# Patient Record
Sex: Female | Born: 1990 | Race: White | Hispanic: No | Marital: Single | Smoking: Never smoker
Health system: Southern US, Community
[De-identification: ages and names within clinical notes are randomized; demographics above are authoritative.]

## PROBLEM LIST (undated history)

## (undated) DIAGNOSIS — D126 Benign neoplasm of colon, unspecified: Secondary | ICD-10-CM

## (undated) DIAGNOSIS — E282 Polycystic ovarian syndrome: Secondary | ICD-10-CM

## (undated) DIAGNOSIS — E119 Type 2 diabetes mellitus without complications: Secondary | ICD-10-CM

## (undated) DIAGNOSIS — F39 Unspecified mood [affective] disorder: Secondary | ICD-10-CM

## (undated) DIAGNOSIS — E669 Obesity, unspecified: Secondary | ICD-10-CM

## (undated) DIAGNOSIS — F419 Anxiety disorder, unspecified: Secondary | ICD-10-CM

## (undated) DIAGNOSIS — K219 Gastro-esophageal reflux disease without esophagitis: Secondary | ICD-10-CM

## (undated) DIAGNOSIS — N921 Excessive and frequent menstruation with irregular cycle: Secondary | ICD-10-CM

## (undated) DIAGNOSIS — Q7962 Hypermobile Ehlers-Danlos syndrome: Secondary | ICD-10-CM

## (undated) DIAGNOSIS — G43909 Migraine, unspecified, not intractable, without status migrainosus: Secondary | ICD-10-CM

## (undated) DIAGNOSIS — R5382 Chronic fatigue, unspecified: Secondary | ICD-10-CM

## (undated) DIAGNOSIS — F32A Depression, unspecified: Secondary | ICD-10-CM

## (undated) DIAGNOSIS — G4719 Other hypersomnia: Secondary | ICD-10-CM

## (undated) HISTORY — DX: Depression, unspecified: F32.A

## (undated) HISTORY — DX: Other hypersomnia: G47.19

## (undated) HISTORY — PX: TONSILLECTOMY: SUR1361

## (undated) HISTORY — DX: Hypermobile Ehlers-Danlos syndrome: Q79.62

## (undated) HISTORY — DX: Obesity, unspecified: E66.9

## (undated) HISTORY — DX: Benign neoplasm of colon, unspecified: D12.6

## (undated) HISTORY — DX: Unspecified mood (affective) disorder: F39

## (undated) HISTORY — DX: Polycystic ovarian syndrome: E28.2

## (undated) HISTORY — DX: Excessive and frequent menstruation with irregular cycle: N92.1

## (undated) HISTORY — DX: Type 2 diabetes mellitus without complications: E11.9

## (undated) HISTORY — DX: Chronic fatigue, unspecified: R53.82

## (undated) HISTORY — DX: Gastro-esophageal reflux disease without esophagitis: K21.9

---
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
## 2012-03-09 DIAGNOSIS — F411 Generalized anxiety disorder: Secondary | ICD-10-CM | POA: Insufficient documentation

## 2012-03-09 DIAGNOSIS — M791 Myalgia, unspecified site: Secondary | ICD-10-CM | POA: Insufficient documentation

## 2012-07-04 DIAGNOSIS — R5383 Other fatigue: Secondary | ICD-10-CM | POA: Insufficient documentation

## 2012-12-24 ENCOUNTER — Emergency Department (INDEPENDENT_AMBULATORY_CARE_PROVIDER_SITE_OTHER)
Admission: EM | Admit: 2012-12-24 | Discharge: 2012-12-24 | Disposition: A | Discharge status: Nursing Home (Includes ICF) | Payer: PRIVATE HEALTH INSURANCE | Source: Home / Self Care | Attending: Family Medicine | Admitting: Family Medicine

## 2012-12-24 ENCOUNTER — Emergency Department (HOSPITAL_COMMUNITY)
Admission: EM | Admit: 2012-12-24 | Discharge: 2012-12-24 | Disposition: A | Discharge status: Home / Self Care | Payer: 59 | Attending: Emergency Medicine | Admitting: Emergency Medicine

## 2012-12-24 ENCOUNTER — Encounter (HOSPITAL_COMMUNITY): Payer: Self-pay | Admitting: Emergency Medicine

## 2012-12-24 ENCOUNTER — Emergency Department (HOSPITAL_COMMUNITY): Payer: 59

## 2012-12-24 DIAGNOSIS — R1011 Right upper quadrant pain: Secondary | ICD-10-CM | POA: Insufficient documentation

## 2012-12-24 DIAGNOSIS — R110 Nausea: Secondary | ICD-10-CM | POA: Insufficient documentation

## 2012-12-24 DIAGNOSIS — F411 Generalized anxiety disorder: Secondary | ICD-10-CM | POA: Insufficient documentation

## 2012-12-24 DIAGNOSIS — R109 Unspecified abdominal pain: Secondary | ICD-10-CM

## 2012-12-24 DIAGNOSIS — R1031 Right lower quadrant pain: Secondary | ICD-10-CM | POA: Insufficient documentation

## 2012-12-24 DIAGNOSIS — G43909 Migraine, unspecified, not intractable, without status migrainosus: Secondary | ICD-10-CM | POA: Insufficient documentation

## 2012-12-24 DIAGNOSIS — Z79899 Other long term (current) drug therapy: Secondary | ICD-10-CM | POA: Insufficient documentation

## 2012-12-24 HISTORY — DX: Anxiety disorder, unspecified: F41.9

## 2012-12-24 HISTORY — DX: Migraine, unspecified, not intractable, without status migrainosus: G43.909

## 2012-12-24 LAB — COMPREHENSIVE METABOLIC PANEL
ALT: 21 U/L (ref 0–35)
AST: 24 U/L (ref 0–37)
Alkaline Phosphatase: 77 U/L (ref 39–117)
CO2: 23 mEq/L (ref 19–32)
Chloride: 103 mEq/L (ref 96–112)
GFR calc Af Amer: >90 mL/min (ref >90)
GFR calc non Af Amer: 89 mL/min — ABNORMAL LOW (ref >90)
Glucose, Bld: 103 mg/dL — ABNORMAL HIGH (ref 70–99)
Potassium: 4.3 mEq/L (ref 3.5–5.1)
Sodium: 137 mEq/L (ref 135–145)
Total Bilirubin: 0.4 mg/dL (ref 0.3–1.2)

## 2012-12-24 LAB — CBC WITH DIFFERENTIAL/PLATELET
Basophils Absolute: 0 10*3/uL (ref 0.0–0.1)
Eosinophils Relative: 3 % (ref 0–5)
Lymphocytes Relative: 28 % (ref 12–46)
Lymphs Abs: 2.2 10*3/uL (ref 0.7–4.0)
MCHC: 34 g/dL (ref 30.0–36.0)
MCV: 83.6 fL (ref 78.0–100.0)
Monocytes Relative: 6 % (ref 3–12)
Neutro Abs: 5 10*3/uL (ref 1.7–7.7)
Platelets: 253 10*3/uL (ref 150–400)
RBC: 4.5 MIL/uL (ref 3.87–5.11)
RDW: 13.4 % (ref 11.5–15.5)
WBC: 7.8 10*3/uL (ref 4.0–10.5)

## 2012-12-24 LAB — POCT URINALYSIS DIP (DEVICE)
Bilirubin Urine: NEGATIVE
Glucose, UA: NEGATIVE mg/dL
Nitrite: NEGATIVE
Protein, ur: NEGATIVE mg/dL
Specific Gravity, Urine: 1.015 (ref 1.005–1.030)
Urobilinogen, UA: 0.2 mg/dL (ref 0.0–1.0)
pH: 5.5 (ref 5.0–8.0)

## 2012-12-24 LAB — POCT PREGNANCY, URINE: Preg Test, Ur: NEGATIVE

## 2012-12-24 MED ORDER — IOHEXOL 300 MG/ML  SOLN
25.0000 mL | INTRAMUSCULAR | Status: AC | PRN
  Administered 2012-12-24 (×2): 25 mL via ORAL

## 2012-12-24 MED ORDER — IOHEXOL 300 MG/ML  SOLN: 100.0000 mL | Freq: Once | INTRAMUSCULAR | Status: DC | PRN

## 2012-12-24 MED ORDER — ONDANSETRON HCL 4 MG/2ML IJ SOLN
4.0000 mg | Freq: Once | INTRAMUSCULAR | Status: AC
  Administered 2012-12-24: 4 mg via INTRAVENOUS
  Filled 2012-12-24: qty 2

## 2012-12-24 MED ORDER — SODIUM CHLORIDE 0.9 % IV BOLUS (SEPSIS)
1000.0000 mL | Freq: Once | INTRAVENOUS | Status: AC
  Administered 2012-12-24: 1000 mL via INTRAVENOUS

## 2012-12-24 MED ORDER — ONDANSETRON HCL 4 MG PO TABS: 4.0000 mg | ORAL_TABLET | Freq: Four times a day (QID) | ORAL | Status: DC

## 2012-12-24 NOTE — ED Notes (Signed)
CT made aware that pt finished her contrast.

## 2012-12-24 NOTE — ED Provider Notes (Signed)
CSN: [id number]     Arrival date & time 12/24/12  1103 History   First MD Initiated Contact with Patient 12/24/12 1108     Chief Complaint  Patient presents with  . Abdominal Pain   (Consider location/radiation/quality/duration/timing/severity/associated sxs/prior Treatment) Patient is a 22 y.o. female presenting with abdominal pain. The history is provided by the patient. No language interpreter was used.  Abdominal Pain Associated symptoms: nausea   Associated symptoms: no chest pain, no chills, no constipation, no diarrhea, no dysuria, no fever, no hematuria, no shortness of breath, no vaginal bleeding, no vaginal discharge and no vomiting   Kaitlyn Alvarez is a 22 y/o F with PMhx of anxiety and migraines presenting to the ED with abdominal pain that has been ongoing since 5:00Am yesterday morning. Patient reported that the pain at first started out in the upper abdominal region described as a burning, stinging sensation with the pain worsening yesterday, but mainly occuring in the lower abdomen. Patient reported that today she presents with pain localized to the right side of the abdomen that is described as a sharp, stabbing pain - burning- with radiation to the right flank. Patient reported that certain positions make the pain worse, reported that yesterday sitting made the pain worse, while laying down made the pain better. Patient reported that the pain worsens when laying down today, while sitting up makes the pain better. Patient reported that she has been experiencing mild nausea. Denied urinary symptoms, dysuria, hematuria, melena, hematochezia, diarrhea, fever, chills, vomiting, vaginal discharge, vaginal bleeding, vaginal pain. PCP none    Past Medical History  Diagnosis Date  . Anxiety   . Migraines    Past Surgical History  Procedure Laterality Date  . Tonsillectomy     No family history on file. History  Substance Use Topics  . Smoking status: Never Smoker   . Smokeless  tobacco: Not on file  . Alcohol Use: Yes     Comment: occasionally   OB History   Grav Para Term Preterm Abortions TAB SAB Ect Mult Living                 Review of Systems  Constitutional: Negative for fever and chills.  Respiratory: Negative for chest tightness and shortness of breath.   Cardiovascular: Negative for chest pain.  Gastrointestinal: Positive for nausea and abdominal pain. Negative for vomiting, diarrhea, constipation, blood in stool and anal bleeding.  Genitourinary: Positive for flank pain (right sided). Negative for dysuria, hematuria, vaginal bleeding, vaginal discharge and vaginal pain.  Musculoskeletal: Negative for back pain.  Neurological: Positive for headaches (patient has history of chronic migraines). Negative for dizziness and weakness.  All other systems reviewed and are negative.    Allergies  Review of patient's allergies indicates no known allergies.  Home Medications   Current Outpatient Rx  Name  Route  Sig  Dispense  Refill  . clonazePAM (KLONOPIN) 1 MG tablet   Oral   Take 1 mg by mouth 2 (two) times daily as needed for anxiety.         Marland Kitchen escitalopram (LEXAPRO) 20 MG tablet   Oral   Take 20 mg by mouth daily.         Marland Kitchen ibuprofen (ADVIL,MOTRIN) 200 MG tablet   Oral   Take 600 mg by mouth every 6 (six) hours as needed for pain.         Marland Kitchen ondansetron (ZOFRAN) 4 MG tablet   Oral   Take 1 tablet (4 mg  total) by mouth every 6 (six) hours.   12 tablet   0    BP 112/76  Pulse 88  Temp(Src) 97.9 F (36.6 C) (Oral)  Resp 16  SpO2 100%  LMP 11/23/2012 Physical Exam  Nursing note and vitals reviewed. Constitutional: She is oriented to person, place, and time. She appears well-developed and well-nourished. No distress.  Patient sitting comfortably in bed   HENT:  Head: Normocephalic and atraumatic.  Neck: Normal range of motion. Neck supple.  Cardiovascular: Normal rate, regular rhythm and normal heart sounds.  Exam reveals no  friction rub.   No murmur heard. Pulses:      Radial pulses are 2+ on the right side, and 2+ on the left side.  Pulmonary/Chest: Effort normal and breath sounds normal. No respiratory distress. She has no wheezes. She has no rales.  Abdominal: Soft. Bowel sounds are normal. She exhibits no distension. There is tenderness in the right upper quadrant and right lower quadrant. There is tenderness at McBurney's point and positive Murphy's sign. There is no guarding and no CVA tenderness.    Discomfort upon palpation to the right side of the abdomen Positive Murphy's sign Positive psoas and obturator Negative CVA tenderness bilaterally  Musculoskeletal: Normal range of motion.  Lymphadenopathy:    She has no cervical adenopathy.  Neurological: She is alert and oriented to person, place, and time. She exhibits normal muscle tone. Coordination normal.  Skin: Skin is warm and dry. No rash noted. She is not diaphoretic. No erythema.  Psychiatric: She has a normal mood and affect. Her behavior is normal. Thought content normal.    ED Course  Procedures (including critical care time)   Patient was seen and assessed [facility] this morning and was instructed to come to the ED to rule out for possible appendicitis. Urine was collected with negative infections - negative nitrites and negative leukocytes- and negative urine pregnancy.   2:27 PM Patient is difficult to get IV needed for contrast for CT abdomen and pelvis to rule out appendicitis - CT changed to [place].   2:32 PM Nurse spoke with patient on the phone regarding CT versus [place], since IV access has been lost. Patient reported that she would rather have the [place] than the CT scan, does not want IV to be placed in her arm again.   Results for orders placed during the hospital encounter of 12/24/12  CBC WITH DIFFERENTIAL      Result Value Range   WBC 7.8  4.0 - 10.5 K/uL   RBC 4.50  3.87 - 5.11 MIL/uL   Hemoglobin 12.8  12.0 - 15.0  g/dL   HCT 16.1  09.6 - 04.5 %   MCV 83.6  78.0 - 100.0 fL   MCH 28.4  26.0 - 34.0 pg   MCHC 34.0  30.0 - 36.0 g/dL   RDW 40.9  81.1 - 91.4 %   Platelets 253  150 - 400 K/uL   Neutrophils Relative % 63  43 - 77 %   Neutro Abs 5.0  1.7 - 7.7 K/uL   Lymphocytes Relative 28  12 - 46 %   Lymphs Abs 2.2  0.7 - 4.0 K/uL   Monocytes Relative 6  3 - 12 %   Monocytes Absolute 0.5  0.1 - 1.0 K/uL   Eosinophils Relative 3  0 - 5 %   Eosinophils Absolute 0.2  0.0 - 0.7 K/uL   Basophils Relative 0  0 - 1 %  Basophils Absolute 0.0  0.0 - 0.1 K/uL  COMPREHENSIVE METABOLIC PANEL      Result Value Range   Sodium 137  135 - 145 mEq/L   Potassium 4.3  3.5 - 5.1 mEq/L   Chloride 103  96 - 112 mEq/L   CO2 23  19 - 32 mEq/L   Glucose, Bld 103 (*) 70 - 99 mg/dL   BUN 11  6 - 23 mg/dL   Creatinine, Ser 1.61  0.50 - 1.10 mg/dL   Calcium 9.4  8.4 - 09.6 mg/dL   Total Protein 7.9  6.0 - 8.3 g/dL   Albumin 4.0  3.5 - 5.2 g/dL   AST 24  0 - 37 U/L   ALT 21  0 - 35 U/L   Alkaline Phosphatase 77  39 - 117 U/L   Total Bilirubin 0.4  0.3 - 1.2 mg/dL   GFR calc non Af Amer 89 (*) >90 mL/min   GFR calc Af Amer >90  >90 mL/min  LIPASE, BLOOD      Result Value Range   Lipase 22  11 - 59 U/L  CG4 I-STAT (LACTIC ACID)      Result Value Range   Lactic Acid, Venous 0.74  0.5 - 2.2 mmol/L    Labs Review Labs Reviewed  COMPREHENSIVE METABOLIC PANEL - Abnormal; Notable for the following:    Glucose, Bld 103 (*)    GFR calc non Af Amer 89 (*)    All other components within normal limits  CBC WITH DIFFERENTIAL  LIPASE, BLOOD  CG4 I-STAT (LACTIC ACID)   Imaging Review US Abdomen Complete  12/24/2012   CLINICAL DATA:  Right upper quadrant abdominal pain with nausea.  EXAM: ULTRASOUND ABDOMEN COMPLETE  COMPARISON:  None.  FINDINGS: Gallbladder  No gallstones or wall thickening visualized. No sonographic Murphy sign noted.  Common bile duct  Diameter: 3.8 mm. No evidence of an intraductal calculus.  Liver  No  focal lesion identified. The left lobe is suboptimally visualized.  IVC  No abnormality visualized. The intrahepatic IVC is not well seen.  Pancreas  Visualized portion unremarkable.  Spleen  Size and appearance within normal limits.  Right Kidney  Length: 11.3 cm. Echogenicity within normal limits. No mass or hydronephrosis visualized.  Left Kidney  Length: 10.8 cm. Echogenicity within normal limits. No mass or hydronephrosis visualized.  Abdominal aorta  No aneurysm visualized. The proximal aorta is suboptimally visualized.  IMPRESSION: No acute abdominal findings. Portions of the abdomen are obscured by bowel gas.   Electronically Signed   By: Roxy Horseman M.D.   On: 12/24/2012 15:49   US Transvaginal Non-ob  12/24/2012   CLINICAL DATA:  Evaluate for possible torsion.  EXAM: TRANSABDOMINAL AND TRANSVAGINAL ULTRASOUND OF PELVIS  DOPPLER ULTRASOUND OF OVARIES  TECHNIQUE: Both transabdominal and transvaginal ultrasound examinations of the pelvis were performed. Transabdominal technique was performed for global imaging of the pelvis including uterus, ovaries, adnexal regions, and pelvic cul-de-sac.  It was necessary to proceed with endovaginal exam following the transabdominal exam to visualize the ovaries. Color and duplex Doppler ultrasound was utilized to evaluate blood flow to the ovaries.  COMPARISON:  None.  FINDINGS: Uterus  Measurements: 7.4 x 3.1 x 3.1 cm. No fibroids or other mass visualized.  Endometrium  Thickness: 4.9 mm.  No focal abnormality visualized.  Right ovary  Measurements: 3.9 x 2.3 x 2.2 cm. Normal appearance/no adnexal mass.  Left ovary  Measurements: 3.1 x 1.6 x 1.5 cm. Normal appearance/no adnexal  mass.  Pulsed Doppler evaluation of both ovaries demonstrates normal low-resistance arterial and venous waveforms.  Other findings  No free fluid.  IMPRESSION: 1. Normal appearance of the uterus and ovaries. 2. No evidence for torsion.   Electronically Signed   By: Rosalie Gums M.D.   On:  12/24/2012 15:45   US Pelvis Complete  12/24/2012   CLINICAL DATA:  Evaluate for possible torsion.  EXAM: TRANSABDOMINAL AND TRANSVAGINAL ULTRASOUND OF PELVIS  DOPPLER ULTRASOUND OF OVARIES  TECHNIQUE: Both transabdominal and transvaginal ultrasound examinations of the pelvis were performed. Transabdominal technique was performed for global imaging of the pelvis including uterus, ovaries, adnexal regions, and pelvic cul-de-sac.  It was necessary to proceed with endovaginal exam following the transabdominal exam to visualize the ovaries. Color and duplex Doppler ultrasound was utilized to evaluate blood flow to the ovaries.  COMPARISON:  None.  FINDINGS: Uterus  Measurements: 7.4 x 3.1 x 3.1 cm. No fibroids or other mass visualized.  Endometrium  Thickness: 4.9 mm.  No focal abnormality visualized.  Right ovary  Measurements: 3.9 x 2.3 x 2.2 cm. Normal appearance/no adnexal mass.  Left ovary  Measurements: 3.1 x 1.6 x 1.5 cm. Normal appearance/no adnexal mass.  Pulsed Doppler evaluation of both ovaries demonstrates normal low-resistance arterial and venous waveforms.  Other findings  No free fluid.  IMPRESSION: 1. Normal appearance of the uterus and ovaries. 2. No evidence for torsion.   Electronically Signed   By: Rosalie Gums M.D.   On: 12/24/2012 15:45   US Abdomen Limited  12/24/2012   CLINICAL DATA:  Right lower quadrant tenderness.  EXAM: LIMITED ABDOMINAL ULTRASOUND  TECHNIQUE: Wallace Cullens scale imaging of the right lower quadrant was performed to evaluate for suspected appendicitis. Standard imaging planes and graded compression technique were utilized.  COMPARISON:  None.  FINDINGS: The appendix is not visualized.  Ancillary findings: None.  Factors affecting image quality: None.  IMPRESSION: No ultrasound evidence for acute appendicitis.   Electronically Signed   By: Rosalie Gums M.D.   On: 12/24/2012 15:38   [place] Art/ven Flow Abd Pelv Doppler  12/24/2012   CLINICAL DATA:  Evaluate for possible torsion.   EXAM: TRANSABDOMINAL AND TRANSVAGINAL ULTRASOUND OF PELVIS  DOPPLER ULTRASOUND OF OVARIES  TECHNIQUE: Both transabdominal and transvaginal ultrasound examinations of the pelvis were performed. Transabdominal technique was performed for global imaging of the pelvis including uterus, ovaries, adnexal regions, and pelvic cul-de-sac.  It was necessary to proceed with endovaginal exam following the transabdominal exam to visualize the ovaries. Color and duplex Doppler ultrasound was utilized to evaluate blood flow to the ovaries.  COMPARISON:  None.  FINDINGS: Uterus  Measurements: 7.4 x 3.1 x 3.1 cm. No fibroids or other mass visualized.  Endometrium  Thickness: 4.9 mm.  No focal abnormality visualized.  Right ovary  Measurements: 3.9 x 2.3 x 2.2 cm. Normal appearance/no adnexal mass.  Left ovary  Measurements: 3.1 x 1.6 x 1.5 cm. Normal appearance/no adnexal mass.  Pulsed Doppler evaluation of both ovaries demonstrates normal low-resistance arterial and venous waveforms.  Other findings  No free fluid.  IMPRESSION: 1. Normal appearance of the uterus and ovaries. 2. No evidence for torsion.   Electronically Signed   By: Rosalie Gums M.D.   On: 12/24/2012 15:45    EKG Interpretation   None       MDM   1. Abdominal pain    Medications  sodium chloride 0.9 % bolus 1,000 mL (0 mLs Intravenous Stopped 12/24/12 1550)  ondansetron (  ZOFRAN) injection 4 mg (4 mg Intravenous Given 12/24/12 1157)  iohexol (OMNIPAQUE) 300 MG/ML solution 25 mL (25 mLs Oral Contrast Given 12/24/12 1230)   Filed Vitals:   12/24/12 1110 12/24/12 1600  BP: 113/76 112/76  Pulse: 94 88  Temp: 97.9 F (36.6 C)   TempSrc: Oral   Resp: 16   SpO2: 96% 100%     Patient presenting to emergency department with abdominal pain that started approximately 5:00 AM yesterday morning. Patient reports that the abdominal pain started in the upper quadrants and has moved its way to the lower quadrant discomfort on the right side abdominal pain  described as a sharp stabbing pain with radiation towards the right flank. Associated symptoms of nausea. Negative emesis and diarrhea. Patient was seen in urgent care Center and recommended to come to emergency department for possible rule out of appendicitis. Urine was collected at urgent care Center with negative findings for urinary tract infections. Urine pregnancy negative. Alert and oriented. Bowel sounds normoactive in all 4 quadrants. Discomfort upon palpation to the right lower quadrant, right upper quadrant and right side of the abdomen. Positive Murphy's sign. Positive McBurney's point. Positive psoas and obturator sign. CBC negative elevation white blood cell count, negative leukocytosis identified. CMP negative findings. Lipase negative elevation. Lactic acid negative elevation. Patient given IV fluids and IV antiemetics. CT scan discontinued since IV line infiltrated, patient agreed to [place] to be performed.  US abdomen negative for gallstones or gallbladder wall thickening. No evidence of common bile duct dilation. Pancreas negative findings. Negative stones or hydronephrosis noted in kidneys bilaterally. [place] of Art/Ven to the abdo/pelvis negative for ovarian torsion. Normal appearance to ovaries and uterus noted. Negative findings for evidence of acute appendicitis.  Doubt appendicitis. Doubt ectopic pregnancy. Doubt cholecystitis. Doubt kidney stones. Doubt pyelonephritis. Etiology of abdominal pain unknown. Patient stable, afebrile. Negative episodes of emesis while in ED setting. Nausea controlled. Patient able to tolerate fluids PO. Discharged patient with antiemetics. Referred patient to PCP and gastroenterology. Discussed with patient to rest and stay hydrated. Discussed with patient diet. Discussed with patient to continue to monitor symptoms and if symptoms are to worsen or change to report back to the ED - strict return instructions given. Patient agreed to plan of care, understood, all  questions answered.   Raymon Mutton, PA-C 12/25/12 1615  Raymon Mutton, PA-C 12/25/12 1617

## 2012-12-24 NOTE — ED Notes (Signed)
Rt sided abd pain that has gotten worse denies dysuria pain is sharp and stabbing went to [facility] and was sent here for ? appy

## 2012-12-24 NOTE — ED Notes (Signed)
Pt returned from ultrasound. Reports mild pain, denies nausea.

## 2012-12-24 NOTE — ED Notes (Addendum)
Pt in CT, IV infiltrated and CT attempted 2 times to start IV with no success. Pt now being transported to ultrasound where she will have imaging done on gall bladder and appendix. Ultrasound made aware.

## 2012-12-24 NOTE — ED Notes (Signed)
Results of lactic acid called to primary nurse [place]

## 2012-12-24 NOTE — ED Notes (Signed)
Contacted CT about wait time.  

## 2012-12-24 NOTE — ED Provider Notes (Signed)
Kaitlyn Alvarez is a 22 y.o. female who presents to Urgent Care today for right-sided abdominal pain. Patient notes initially mild but now moderate to severe right-sided abdominal pain. Her symptoms started at 5:00 yesterday morning. He awoke her from sleep. Her pain was initially upper but now is more located in the right lower quadrant.  She additionally notes loss of appetite with her last meal being yesterday. She has tried over-the-counter pain medications, and Gas-X and TUMS which have not helped. She does not think her pain is related to food. She notes the pain is constant and worsening. The pain interfered with sleep last night. No vomiting or diarrhea. Patient is currently being worked up for celiac disease as her father has this.    Past Medical History  Diagnosis Date  . Anxiety   . Migraines    No surgical history aside from tonsillectomy. History  Substance Use Topics  . Smoking status: Never Smoker   . Smokeless tobacco: Not on file  . Alcohol Use: Yes     Comment: occasionally   ROS as above Medications reviewed. No current facility-administered medications for this encounter.   Current Outpatient Prescriptions  Medication Sig Dispense Refill  . escitalopram (LEXAPRO) 20 MG tablet Take 20 mg by mouth daily.      . clonazePAM (KLONOPIN) 1 MG tablet Take 1 mg by mouth 2 (two) times daily as needed for anxiety.        Exam:  BP 128/85  Pulse 74  Temp(Src) 98 F (36.7 C) (Oral)  Resp 17  SpO2 97%  LMP 11/23/2012 Gen: Well NAD HEENT: EOMI,  MMM Lungs: CTABL Nl WOB Heart: RRR no MRG Abd: Hypoactive bowel sounds. Nondistended. Tender to palpation in the right lower quadrant and right upper quadrant. Mildly positive Murphy sign. Guarding in the right lower quadrant with mild rebound. Exts: Non edematous BL  LE, warm and well perfused.   Results for orders placed during the hospital encounter of 12/24/12 (from the past 24 hour(s))  POCT URINALYSIS DIP (DEVICE)      Status: Abnormal   Collection Time    12/24/12 10:48 AM      Result Value Range   Glucose, UA NEGATIVE  NEGATIVE mg/dL   Bilirubin Urine NEGATIVE  NEGATIVE   Ketones, ur NEGATIVE  NEGATIVE mg/dL   Specific Gravity, Urine 1.015  1.005 - 1.030   Hgb urine dipstick TRACE (*) NEGATIVE   pH 5.5  5.0 - 8.0   Protein, ur NEGATIVE  NEGATIVE mg/dL   Urobilinogen, UA 0.2  0.0 - 1.0 mg/dL   Nitrite NEGATIVE  NEGATIVE   Leukocytes, UA NEGATIVE  NEGATIVE  POCT PREGNANCY, URINE     Status: None   Collection Time    12/24/12 10:54 AM      Result Value Range   Preg Test, Ur NEGATIVE  NEGATIVE   No results found.  Assessment and Plan: 22 y.o. female with abdominal pain. Concerning for gallstones or appendicitis. Patient likely needs abdominal imaging to further evaluate this issue. I will not delay diagnosis by obtaining laboratory findings as I do not feel that normal labs and be totally reassuring. Plan to transfer to ED for further evaluation and management via shuttle.  Discussed warning signs or symptoms. Please see discharge instructions. Patient expresses understanding.      Rodolph Bong, MD 12/24/12 1101

## 2012-12-24 NOTE — ED Notes (Signed)
Onset yesterday  At 0500 pt woke with upper ABD pain, now lower ABD pain with radiation to the right side.   She has also had nausea, no emesis.  Last meal was last night.

## 2013-01-01 NOTE — ED Provider Notes (Signed)
Medical screening examination/treatment/procedure(s) were conducted as a shared visit with non-physician practitioner(s) and myself.  I personally evaluated the patient during the encounter.  EKG Interpretation   None       Pt examined.  No peritoneal irritation.  History of pain for over 30 hours.  Not a surgical abdomen.  No history of evolving pain to suggest appendicitis. Exam shows non localizing pain.  I don't think CT is indicates with low suspicion for Appendicitis.  Lab and U/S r/o cholecystitis.  Roney Marion, MD 01/01/13 [phone]

## 2013-11-08 ENCOUNTER — Ambulatory Visit (INDEPENDENT_AMBULATORY_CARE_PROVIDER_SITE_OTHER): Payer: BC Managed Care – PPO | Admitting: Psychiatry

## 2013-11-08 ENCOUNTER — Encounter (HOSPITAL_COMMUNITY): Payer: Self-pay | Admitting: Psychiatry

## 2013-11-08 VITALS — BP 117/78 | HR 112 | Ht 64.0 in | Wt 190.0 lb

## 2013-11-08 DIAGNOSIS — F909 Attention-deficit hyperactivity disorder, unspecified type: Secondary | ICD-10-CM

## 2013-11-08 DIAGNOSIS — F902 Attention-deficit hyperactivity disorder, combined type: Secondary | ICD-10-CM

## 2013-11-08 MED ORDER — LISDEXAMFETAMINE DIMESYLATE 40 MG PO CAPS: 40.0000 mg | ORAL_CAPSULE | ORAL | Status: DC

## 2013-11-08 NOTE — Progress Notes (Signed)
Psychiatric Assessment Adult  Patient Identification:  Kaitlyn Alvarez Date of Evaluation:  11/08/2013 Chief Complaint: ADHD History of Chief Complaint:  No chief complaint on file.   HPI Pt is 23 year old Caucasian female, with h/o ADHD, and was placed on Vyvanse 30 mg po QAM.She reports it wears off in afternoon Prescription written at [facility]. She has one psychiatric hospitalization at [facility], in which she had anxiety. Sleeping and eating are normal. Concentration is fair. She denies SI/HI/AVH. She denies depression, anxiety, mania, or psychosis. Euthymic. She is a Consulting civil engineer, and needs to focus. She is unemployed, and lives at [place]. Will increase Vyvanse to 40 mg po for ADHD symptoms. Rtc in 4 weeks  Review of Systems Physical Exam  Depressive Symptoms: denies   (Hypo) Manic Symptoms:   Elevated Mood:  No Irritable Mood:  No Grandiosity:  No Distractibility:  Yes Labiality of Mood:  No Delusions:  No Hallucinations:  No Impulsivity:  Yes Sexually Inappropriate Behavior:  No Financial Extravagance:  No Flight of Ideas:  No  Anxiety Symptoms: Excessive Worry:  No Panic Symptoms:  No Agoraphobia:  No Obsessive Compulsive: No  Symptoms: None, Specific Phobias:  No Social Anxiety:  No  Psychotic Symptoms:  Hallucinations: No None Delusions:  No Paranoia:  No   Ideas of Reference:  No  PTSD Symptoms: Ever had a traumatic exposure:  No Had a traumatic exposure in the last month:  No Re-experiencing: No None Hypervigilance:  No Hyperarousal: No None Avoidance: No None  Traumatic Brain Injury: No   Past Psychiatric History: Diagnosis: ADHD  Hospitalizations: [facility] in October 2012, for anxiety from Gluten, having Migraines   Outpatient Care: yes   Substance Abuse Care: no  Self-Mutilation: cutting behaviors at age 77 r/t anxiety, but has stopped.   Suicidal Attempts: no  Violent Behaviors: no   Past Medical History:   Past Medical History   Diagnosis Date  . Anxiety   . Migraines    History of Loss of Consciousness:  No Seizure History:  Yes Cardiac History:  No Allergies:  No Known Allergies Current Medications:  Current Outpatient Prescriptions  Medication Sig Dispense Refill  . clonazePAM (KLONOPIN) 1 MG tablet Take 1 mg by mouth 2 (two) times daily as needed for anxiety.      Marland Kitchen escitalopram (LEXAPRO) 20 MG tablet Take 20 mg by mouth daily.      Marland Kitchen ibuprofen (ADVIL,MOTRIN) 200 MG tablet Take 600 mg by mouth every 6 (six) hours as needed for pain.      Marland Kitchen ondansetron (ZOFRAN) 4 MG tablet Take 1 tablet (4 mg total) by mouth every 6 (six) hours.  12 tablet  0   No current facility-administered medications for this visit.    Previous Psychotropic Medications:  Medication Dose   see aboove                       Substance Abuse History in the last 12 months: None Substance Age of 1st Use Last Use Amount Specific Type  Nicotine      Alcohol  20  last weekend  2 drinks, a week. Mixed drinks    Cannabis      Opiates      Cocaine      Methamphetamines      LSD      Ecstasy      Benzodiazepines      Caffeine      Inhalants  Others:                         Social History: Current Place of Residence: [place] Place of Birth: [place] Family Members: live in [place]. Parents and brother,age 105  Marital Status:  Single Children:   Sons:   Daughters:  Relationships: none Education:  Corporate treasurer Problems/Performance: no Religious Beliefs/Practices: jewish, not very religious History of Abuse: none Occupational Experience: no Military History:  None. Legal History: no Hobbies/Interests: arts and crafts   Family History:  No family history on file.  Mental Status Examination/Evaluation: Objective:  Appearance: Casual, Fairly Groomed and Guarded Scientist, research (physical sciences)::  Fair  Speech:  Normal Rate  Volume:  Normal  Mood:  anxious  Affect:  Appropriate and Congruent   Thought Process:  Coherent and Linear  Orientation:  Full (Time, Place, and Person)  Thought Content:  Rumination  Suicidal Thoughts:  No  Homicidal Thoughts:  No  Judgement:  Fair  Insight:  Fair  Psychomotor Activity:  Restlessness  Akathisia:  No  Handed:  Right  AIMS (if indicated):  AIMS: Facial and Oral Movements Muscles of Facial Expression: None, normal Lips and Perioral Area: None, normal Jaw: None, normal Tongue: None, normal,Extremity Movements Upper (arms, wrists, hands, fingers): None, normal Lower (legs, knees, ankles, toes): None, normal, Trunk Movements Neck, shoulders, hips: None, normal, Overall Severity Severity of abnormal movements (highest score from questions above): None, normal Incapacitation due to abnormal movements: None, normal Patient's awareness of abnormal movements (rate only patient's report): No Awareness, Dental Status Current problems with teeth and/or dentures?: No Does patient usually wear dentures?: No  Assets:  Leisure Time Physical Health Resilience Social Support    Laboratory/X-Ray Psychological Evaluation(s)   NA  Dr. Marius Ditch   Assessment:  Axis I: ADHD, combined type  AXIS I ADHD, combined type  AXIS II Cluster B Traits  AXIS III Past Medical History  Diagnosis Date  . Anxiety   . Migraines      AXIS IV economic problems, educational problems, housing problems, other psychosocial or environmental problems, problems related to legal system/crime, problems related to social environment, problems with access to health care services and problems with primary support group  AXIS V 51-60 moderate symptoms   Treatment Plan/Recommendations:  Plan of Care: medication  Laboratory:    Psychotherapy: no  Medications: Vyvanse 40 mg po QD for ADHD  Routine PRN Medications:  No  Consultations: as needed   Safety Concerns:  no  Other:      Kendrick Fries, NP 9/17/20153:11 PM

## 2013-12-06 ENCOUNTER — Ambulatory Visit (INDEPENDENT_AMBULATORY_CARE_PROVIDER_SITE_OTHER): Payer: BC Managed Care – PPO | Admitting: Psychiatry

## 2013-12-06 VITALS — BP 119/72 | HR 80 | Ht 65.0 in | Wt 179.2 lb

## 2013-12-06 DIAGNOSIS — F902 Attention-deficit hyperactivity disorder, combined type: Secondary | ICD-10-CM

## 2013-12-06 DIAGNOSIS — F325 Major depressive disorder, single episode, in full remission: Secondary | ICD-10-CM

## 2013-12-10 NOTE — Progress Notes (Signed)
Patient ID: Kaitlyn Alvarez, female   DOB: 03/12/1990, 23 y.o.   MRN: [id number]  Psychiatric medication management visi  Patient Identification:  Kaitlyn MedicusStephanie Inga Date of Evaluation:  12/06/2013 Chief Complaint: ADHD History of Chief Complaint:   Chief Complaint  Patient presents with  . ADHD  . Follow-up    HPI Patient is 23 year old Caucasian female who presents today for followup visit. Patient reports that she was never diagnosed with ADHD but was prescribed Vyvanse at  [facility] which was then continued by Landis MartinsMeghan Blankman at this clinic.  She states that she has problems with staying focused , complete the work and gets distracted easily. She adds that her brother is diagnosed with ADHD and is a significant family history of ADHD. She reports that she has never been diagnosed or tested for ADHD but has had some benefit with the Vyvanse.  In regards to her past psychiatric history, patient reports that she was hospitalized many years ago at [facility]. She states that it was due to anxiety and depression. She currently denies any symptoms of depression or anxiety and reports that overall she is doing fairly well. She also denies any symptoms of mania, psychosis, any other complaints at this visit. Review of Systems  Constitutional: Negative.  Negative for activity change, appetite change, fatigue and unexpected weight change.  HENT: Negative.  Negative for congestion, dental problem, sinus pressure, sneezing, sore throat and trouble swallowing.   Eyes: Negative.  Negative for discharge and visual disturbance.  Respiratory: Negative.  Negative for apnea, shortness of breath and wheezing.   Cardiovascular: Negative.  Negative for palpitations.  Gastrointestinal: Negative.  Negative for nausea, vomiting, abdominal pain, diarrhea and constipation.  Endocrine: Negative.  Negative for cold intolerance and heat intolerance.  Genitourinary: Negative.  Negative for difficulty urinating, menstrual  problem and pelvic pain.  Musculoskeletal: Negative.  Negative for arthralgias and myalgias.  Skin: Negative.  Negative for color change.  Allergic/Immunologic: Negative.  Negative for food allergies.  Neurological: Negative.  Negative for dizziness, syncope, weakness and light-headedness.  Hematological: Negative.  Does not bruise/bleed easily.  Psychiatric/Behavioral: Positive for decreased concentration. Negative for suicidal ideas, hallucinations, behavioral problems, confusion, sleep disturbance, self-injury, dysphoric mood and agitation. The patient is not nervous/anxious and is not hyperactive.    Physical Exam Blood pressure 119/72, pulse 80, height 5\' 5"  (1.651 m), weight 179 lb 3.2 oz (81.285 kg).   Past Psychiatric History: Diagnosis: ADHD  Hospitalizations: [facility] in October 2012, for anxiety from Gluten, having Migraines   Outpatient Care: yes   Substance Abuse Care: no  Self-Mutilation: cutting behaviors at age 23 r/t anxiety, but has stopped.   Suicidal Attempts: no  Violent Behaviors: no   Past Medical History:   Past Medical History  Diagnosis Date  . Anxiety   . Migraines    History of Loss of Consciousness:  No Seizure History:  Yes Cardiac History:  No Allergies:  No Known Allergies Current Medications:  Current Outpatient Prescriptions  Medication Sig Dispense Refill  . HYDROcodone-acetaminophen (NORCO/VICODIN) 5-325 MG per tablet Take by mouth.      . lidocaine (XYLOCAINE) 2 % solution 200 mg.      . ondansetron (ZOFRAN) 4 MG tablet Take 4 mg by mouth.      . propranolol ER (INDERAL LA) 60 MG 24 hr capsule Take 60 mg by mouth.      . rizatriptan (MAXALT) 10 MG tablet Take 10 mg by mouth.      .Marland Kitchen  ibuprofen (ADVIL,MOTRIN) 200 MG tablet Take 600 mg by mouth every 6 (six) hours as needed for pain.      Marland Kitchen. ondansetron (ZOFRAN) 4 MG tablet Take 1 tablet (4 mg total) by mouth every 6 (six) hours.  12 tablet  0   No current facility-administered  medications for this visit.    Substance Abuse History in the last 12 months: None  Social History: Current Place of Residence: [place] Place of Birth: [place] Family Members: live in [place]. Parents and brother,age 65  Marital Status:  Single Children:   Sons:   Daughters:  Relationships: none Education:  [employer]ate [employer] Educational Problems/Performance: no Religious Beliefs/Practices: jewish, not very religious History of Abuse: none Occupational Experience: no Military History:  None. Legal History: no Hobbies/Interests: arts and crafts   Family History:  Patient's brother is diagnosed with ADHD combined type  General Appearance: alert, oriented, no acute distress and well nourished  Musculoskeletal: Strength & Muscle Tone: within normal limits Gait & Station: normal Patient leans: N/A Mental Status Examination/Evaluation: Objective:  Appearance: Casual, Fairly Groomed and Guarded Scientist, research (physical sciences)Green tips  Eye Contact::  Fair  Speech:  Normal Rate  Volume:  Normal  Mood:  anxious  Affect:  Appropriate and Congruent  Thought Process:  Coherent and Linear  Orientation:  Full (Time, Place, and Person)  Thought Content:  WDL  Suicidal Thoughts:  No  Homicidal Thoughts:  No  Judgement:  Fair  Insight:  Fair  Psychomotor Activity:  Normal  Akathisia:  No  Handed:  Right    Assets:  Leisure Time Physical Health Resilience Social Support     Assessment:  Axis I: 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 Anxiety   . Migraines      AXIS IV economic problems, educational problems, housing problems, other psychosocial or environmental problems, problems related to legal system/crime, problems related to social environment, problems with access to health care services and problems with  primary support group  AXIS V 51-60 moderate symptoms   Treatment Plan/Recommendations:  Plan of Care: discontinue Vyvanse  Laboratory: none at this time   Psychotherapy: discussed with patient the need to see a therapist to help her time management, organizational skills and coping  Medications: none at this time  Routine PRN Medications:  No  Consultations: discussed the need to have ADHD testing done, information about psychologist who could do this testing was given to patient at this visit  Safety Concerns:  None reported  Other:  Call when necessary Followup with Dr. Laurelyn SickleAggarwal once testing is completed    Nelly RoutKUMAR,Nivek Powley, MD 10/19/20157:15 AM

## 2013-12-14 ENCOUNTER — Ambulatory Visit (INDEPENDENT_AMBULATORY_CARE_PROVIDER_SITE_OTHER): Payer: No Typology Code available for payment source | Admitting: Psychology

## 2013-12-14 DIAGNOSIS — F902 Attention-deficit hyperactivity disorder, combined type: Secondary | ICD-10-CM

## 2014-01-03 ENCOUNTER — Encounter (HOSPITAL_COMMUNITY): Payer: Self-pay | Admitting: Psychiatry

## 2014-01-03 ENCOUNTER — Ambulatory Visit (INDEPENDENT_AMBULATORY_CARE_PROVIDER_SITE_OTHER): Payer: BC Managed Care – PPO | Admitting: Psychiatry

## 2014-01-03 VITALS — BP 116/75 | HR 95 | Ht 65.0 in | Wt 177.6 lb

## 2014-01-03 DIAGNOSIS — F325 Major depressive disorder, single episode, in full remission: Secondary | ICD-10-CM

## 2014-01-03 DIAGNOSIS — F909 Attention-deficit hyperactivity disorder, unspecified type: Secondary | ICD-10-CM | POA: Insufficient documentation

## 2014-01-03 DIAGNOSIS — F900 Attention-deficit hyperactivity disorder, predominantly inattentive type: Secondary | ICD-10-CM

## 2014-01-03 NOTE — Progress Notes (Signed)
[facility] [id number] Progress Note  Kaitlyn Alvarez [id number] 23 y.o.  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 States after being taken off Vyvanse she has not been doing well. Pt has trouble with procrastination, easily distracted, unable to focus on one task, poor memory, difficulty doing complex tasks, misplacing items such as phone, keys and wallet. Pt states her grades are starting to suffer. States grades are usually good b/c she has school anxiety and will cram the night before an exam. Reports she school transcripts will show straight A's.    Pt had psychological testing at [facility] which showed low-average to low functioning on the CNS. The diagnostic impression is ADHD-primarily Inattentive presentation.  Pt was seeing Dr. Marlise [place]vesew in [place] on/off for 2 yrs. States Dr. Marlise Evesew began treating pt with Vyvanse while being undiagnosed with ADHD.   Today pt denies depression and anxiety. Denies isolation, worthlessness, crying spells and anhedonia. Sleep is good. Appetite and energy are good.   Suicidal Ideation: No Plan Formed: No Patient has means to carry out plan: No  Homicidal Ideation: No Plan Formed: No Patient has means to carry out plan: No  Review of Systems: Psychiatric: Agitation: No Hallucination: No Depressed Mood: No Insomnia: No Hypersomnia: No Altered Concentration: Yes Feels Worthless: No Grandiose Ideas: No Belief In Special Powers: No New/Increased Substance Abuse: No Compulsions: No  Neurologic: Headache: Yes migraines improved when gluten was removed from her diet Seizure: No Paresthesias: No   Review of Systems  Constitutional: Negative for fever, chills and weight loss.  HENT: Negative for congestion, ear discharge and sore throat.   Eyes: Negative for blurred vision, double vision and redness.  Respiratory: Negative for  cough, hemoptysis, shortness of breath and wheezing.   Cardiovascular: Negative for chest pain, palpitations and leg swelling.  Gastrointestinal: Negative for heartburn, nausea, vomiting and abdominal pain.  Musculoskeletal: Negative for myalgias, back pain, joint pain and neck pain.  Skin: Negative for itching and rash.  Neurological: Positive for headaches. Negative for dizziness, tingling, seizures and weakness.  Psychiatric/Behavioral: Negative for depression, suicidal ideas, hallucinations, memory loss and substance abuse. The patient is not nervous/anxious and does not have insomnia.      Past Medical Family, Social History: pt living with friends. Goes to [employer] studying art. Her parents live in [place] and she has one brother who is 18yo. Pt is single.  reports that she has never smoked. She has never used smokeless tobacco. She reports that she drinks about 1.2 oz of alcohol per week. She reports that she does not use illicit drugs.  Family History  Problem Relation Age of Onset  . ADD / ADHD Brother    Past Medical History  Diagnosis Date  . Anxiety   . Migraines    Outpatient Encounter Prescriptions as of 01/03/2014  Medication Sig  . HYDROcodone-acetaminophen (NORCO/VICODIN) 5-325 MG per tablet Take by mouth.  . ondansetron (ZOFRAN) 4 MG tablet Take 1 tablet (4 mg total) by mouth every 6 (six) hours.  . propranolol ER (INDERAL LA) 60 MG 24 hr capsule Take 60 mg by mouth.  . rizatriptan (MAXALT) 10 MG tablet Take 10 mg by mouth.  Marland Kitchen. ibuprofen (ADVIL,MOTRIN) 200 MG tablet Take 600 mg by mouth every 6 (six) hours as needed for pain.  Marland Kitchen. lidocaine (XYLOCAINE) 2 % solution 200 mg.    Past Psychiatric History/Hospitalization(s): Anxiety:  Yes Bipolar Disorder: No Depression: Yes Mania: No Psychosis: No Schizophrenia: No Personality Disorder: No Hospitalization for psychiatric illness: Yes History of Electroconvulsive Shock Therapy: No Prior Suicide Attempts: No  Physical  Exam: Constitutional:  There were no vitals taken for this visit.  General Appearance: alert, oriented, no acute distress  Musculoskeletal: Strength & Muscle Tone: within normal limits Gait & Station: normal Patient leans: N/A  Mental Status Examination/Evaluation: Objective: Attitude: Calm and cooperative  Appearance: Casual, hair is dyed blue and green,  appears to be stated age  Eye Contact::  Good  Speech:  Clear and Coherent and Normal Rate  Volume:  Normal  Mood:  euthymic  Affect:  Full Range  Thought Process:  Goal Directed, Linear and Logical  Orientation:  Full (Time, Place, and Person)  Thought Content:  Negative  Suicidal Thoughts:  No  Homicidal Thoughts:  No  Judgement:  Other:  limited  Insight:  Shallow  Concentration: good  Memory: Immediate-intact Recent-intact Remote-intact  Recall: fair  Language: fair  Gait and Station: normal  [employer] of Knowledge: average  Psychomotor Activity:  Normal  Akathisia:  No  Handed:  Right  AIMS (if indicated):  n/a  Assets:  Manufacturing systems engineerCommunication Skills Desire for Improvement Financial Resources/Insurance Housing Leisure Time Physical Health Resilience Social Support Location managerTalents/Skills Transportation Vocational/Educational       Medical Decision Making (Choose Three): Review of Psycho-Social Stressors (1), Decision to obtain old records (1), Established Problem, Worsening (2) and Review or Order of Psychological Test(s) (1)  Assessment: AXIS I ADHD- inattentive type, major depressive disorder in remission  AXIS II Cluster B Traits  AXIS III Past Medical History  Diagnosis Date  . Anxiety   . Migraines      AXIS IV economic problems, educational problems, housing problems, other psychosocial or environmental problems, problems related to legal system/crime, problems related to social environment, problems with access to health care services and problems with primary support group  AXIS V  51-60 moderate symptoms   Treatment Plan/Recommendations:  Plan of Care:  Medication management with supportive therapy. Risks/benefits and SE of the medication discussed. Pt verbalized understanding and verbal consent obtained for treatment.  Affirm with the patient that the medications are taken as ordered. Patient expressed understanding of how their medications were to be used.  -worsening of symptoms  Today pt reports she is suffering from symptoms of ADD and CNS testing confirms this. Pt is not reporting any impairment in functioning in school or personal life. Overall she is doing well and doesn't appear to be in need of treatment with stimulants at this time.   Laboratory: none at this time   Psychotherapy: Therapy: brief supportive therapy provided. Discussed psychosocial stressors in detail.    discussed with patient the need to see a therapist to help her time management, organizational skills and coping  Medications: none at this time  Routine PRN Medications: No  Consultations:none at this time  Safety Concerns: Pt denies SI and is at an acute low risk for suicide.Patient told to call clinic if any problems occur. Patient advised to go to ER if they should develop SI/HI, side effects, or if symptoms worsen. Has crisis numbers to call if needed. Pt verbalized understanding.   Other: F/up in 1 month or sooner if needed   Obtain copy of pt's college transcripts and pt will sign MR to copy of records from Dr. Dianne Dunew    Masud Holub, MD 01/03/2014

## 2014-01-28 DIAGNOSIS — G43709 Chronic migraine without aura, not intractable, without status migrainosus: Secondary | ICD-10-CM | POA: Insufficient documentation

## 2014-03-28 DIAGNOSIS — T50905A Adverse effect of unspecified drugs, medicaments and biological substances, initial encounter: Secondary | ICD-10-CM | POA: Insufficient documentation

## 2014-05-18 IMAGING — US US ART/VEN ABD/PELV/SCROTUM DOPPLER LTD
1 series · 14 of 25 positions shown · non-contrast
Comparison: None.

CLINICAL DATA: Evaluate for possible torsion.

EXAM:
TRANSABDOMINAL AND TRANSVAGINAL ULTRASOUND OF PELVIS
DOPPLER ULTRASOUND OF OVARIES
TECHNIQUE: Both transabdominal and transvaginal ultrasound examinations of the
pelvis were performed. Transabdominal technique was performed for
global imaging of the pelvis including uterus, ovaries, adnexal
regions, and pelvic cul-de-sac.
It was necessary to proceed with endovaginal exam following the
transabdominal exam to visualize the ovaries. Color and duplex
Doppler ultrasound was utilized to evaluate blood flow to the
ovaries.

[Series 1: us art/ven abd/pelv/scrotum doppler ltd · 0.25mm/px · 14 of 70 slices shown]
[im 1/70]
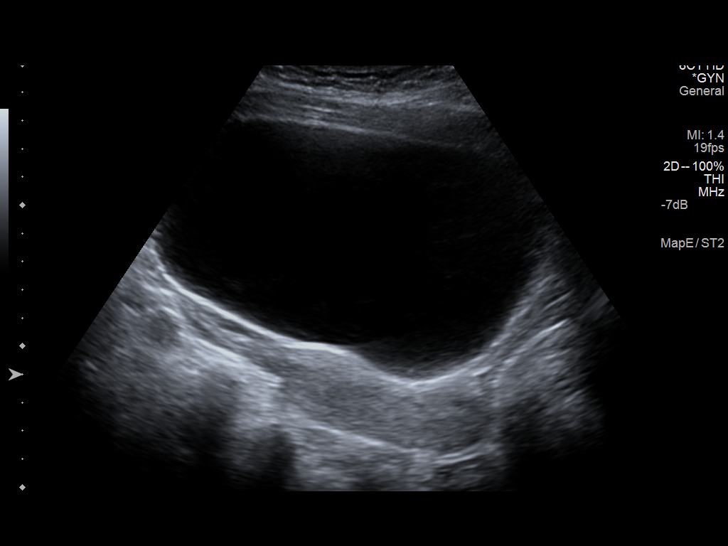
[im 6/70]
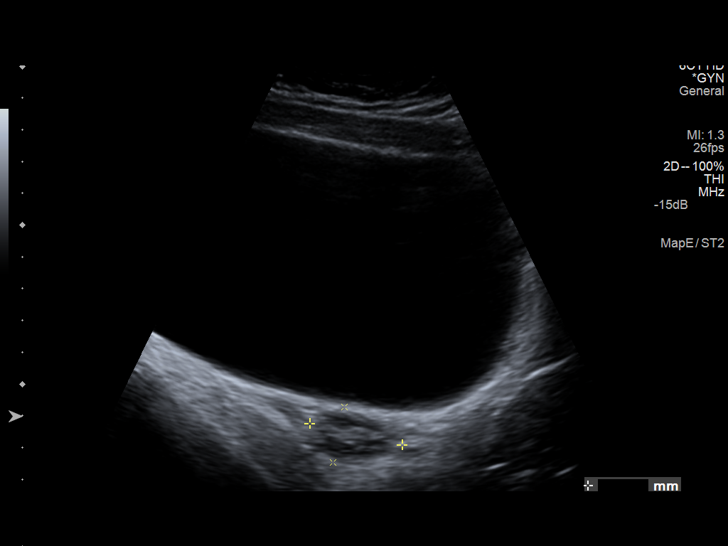
[im 12/70]
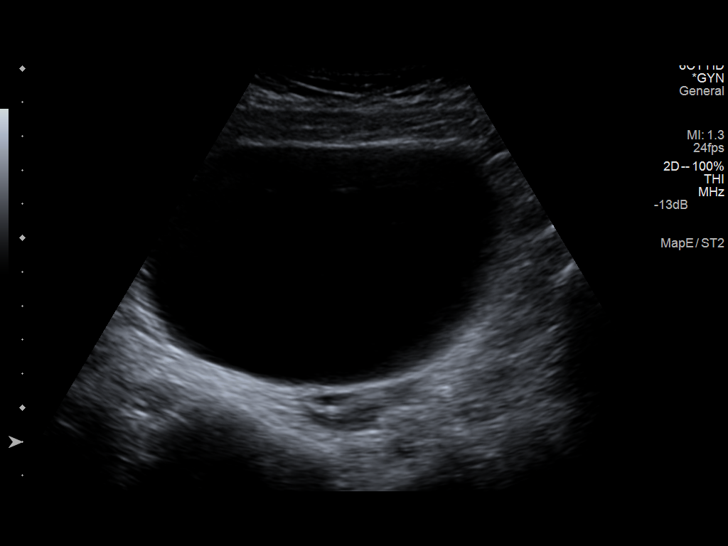
[im 18/70]
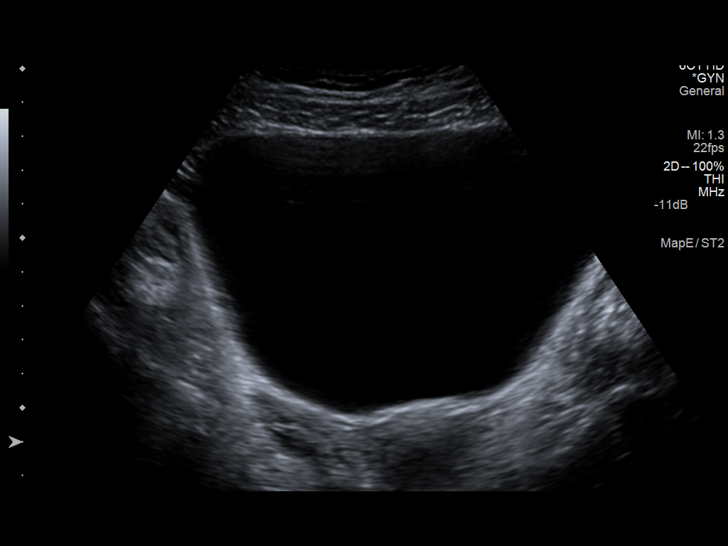
[im 24/70]
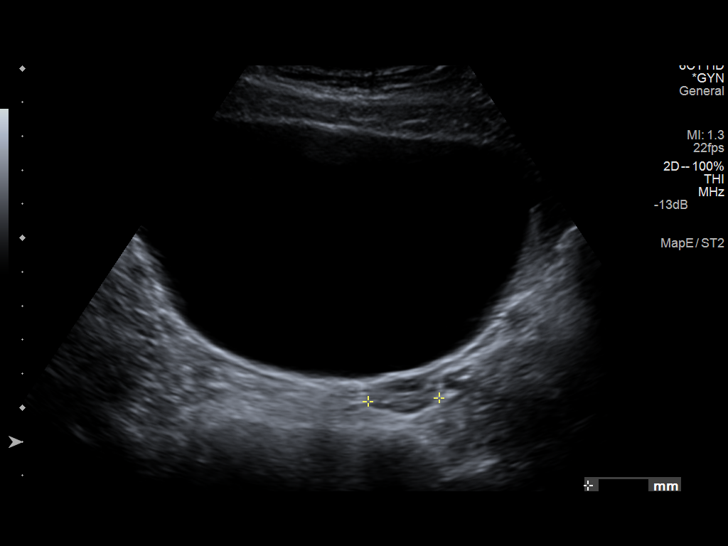
[im 26/70]
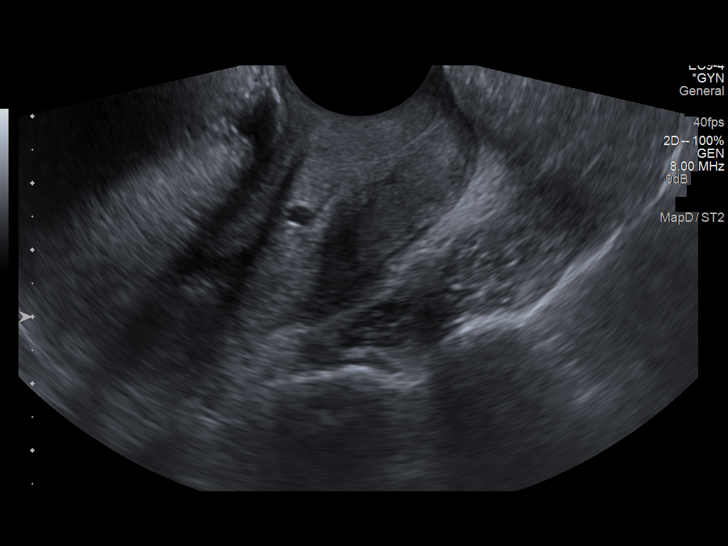
[im 32/70]
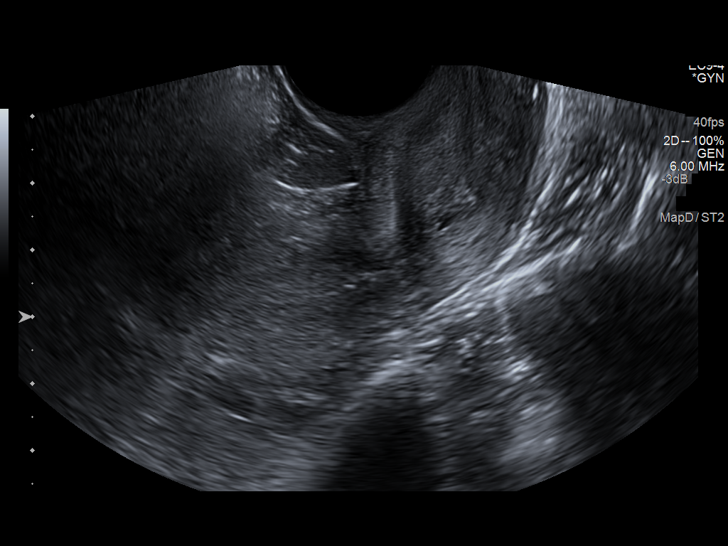
[im 38/70]
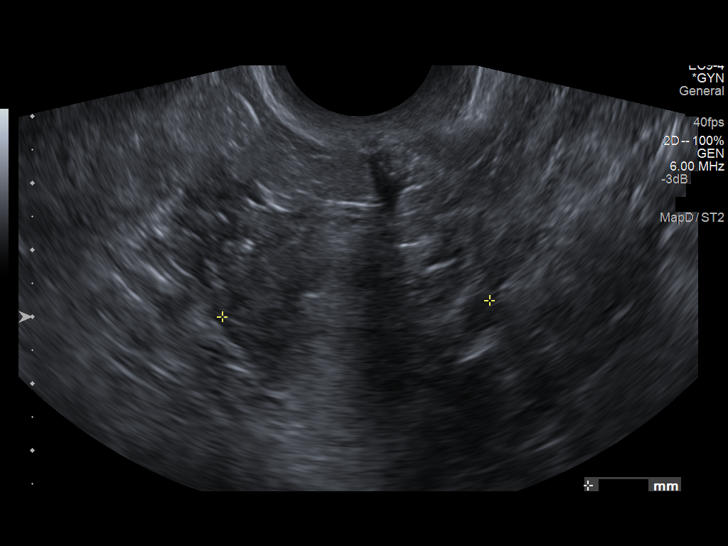
[im 44/70]
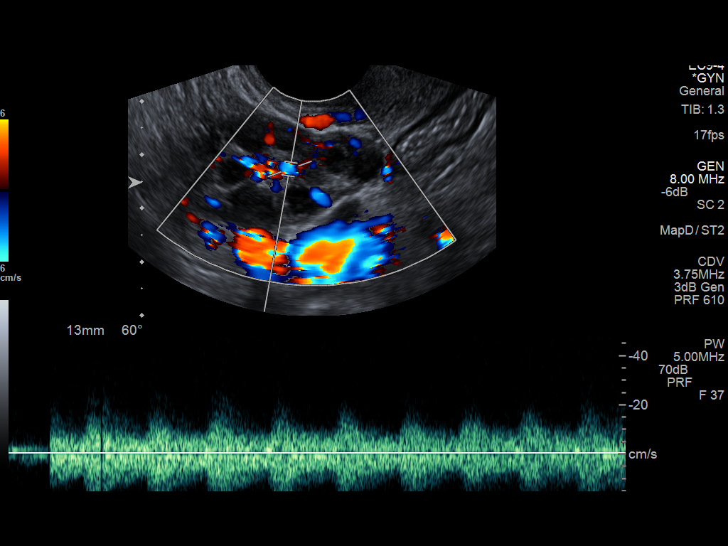
[im 47/70]
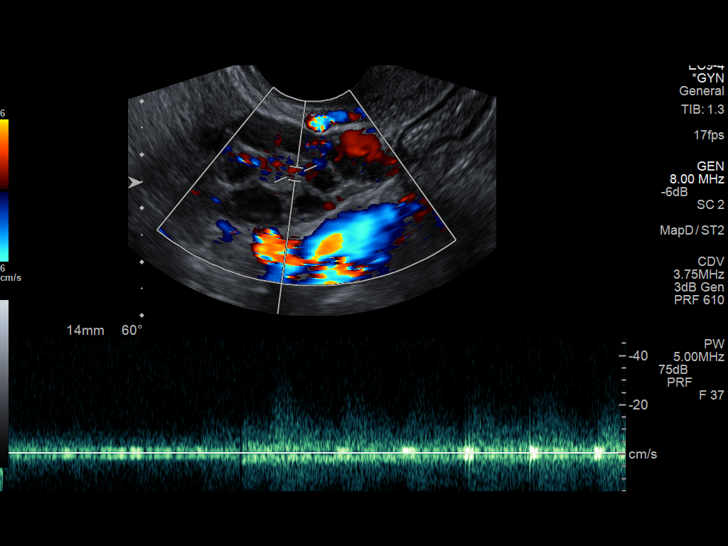
[im 52/70]
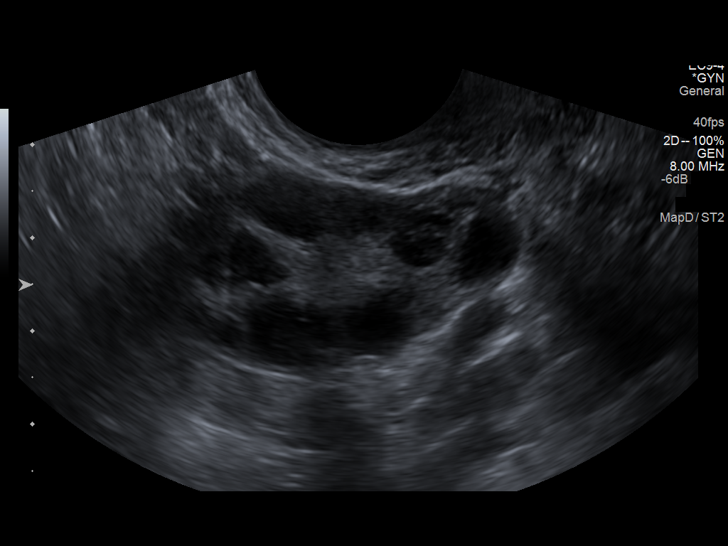
[im 58/70]
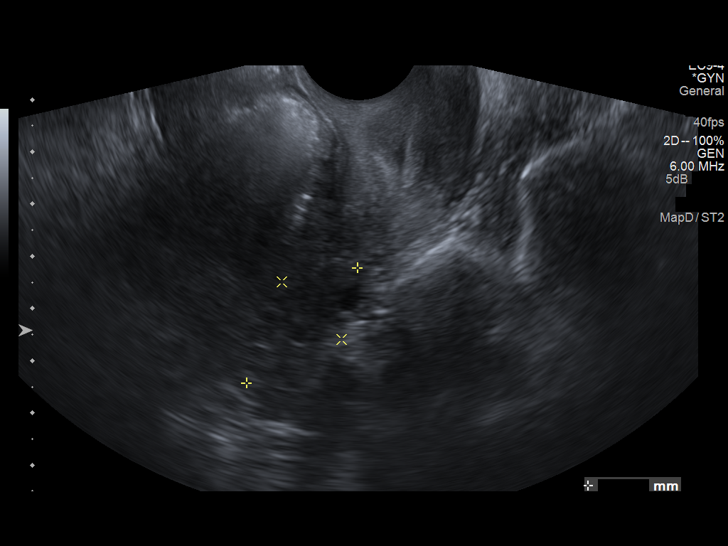
[im 64/70]
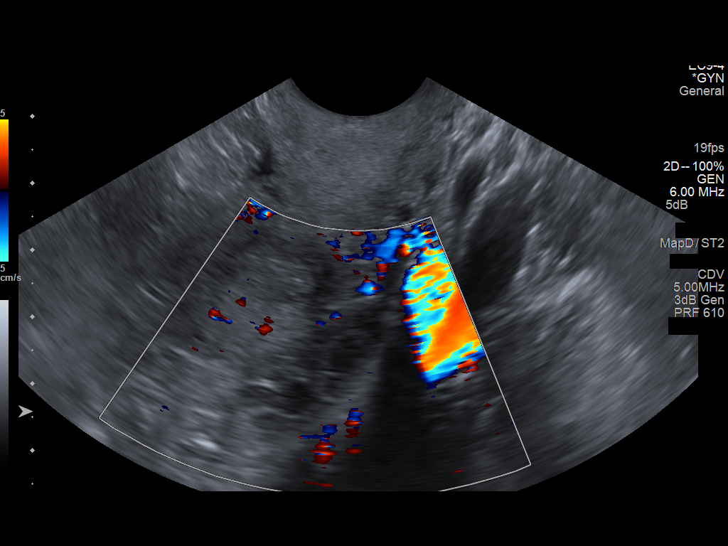
[im 70/70]
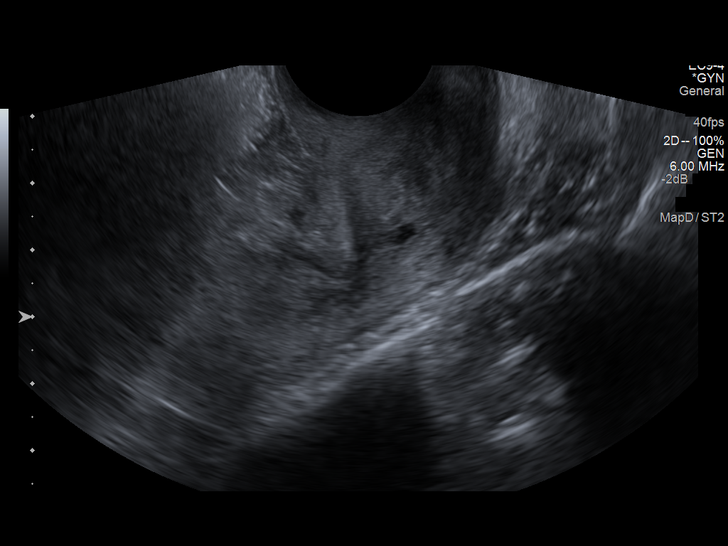

[14 of 25 positions shown; findings below may reference images not displayed]

FINDINGS: Uterus

Measurements: 7.4 x 3.1 x 3.1 cm. No fibroids or other mass
visualized.

Endometrium

Thickness: 4.9 mm.  No focal abnormality visualized.

Right ovary

Measurements: 3.9 x 2.3 x 2.2 cm. Normal appearance/no adnexal mass.

Left ovary

Measurements: 3.1 x 1.6 x 1.5 cm. Normal appearance/no adnexal mass.

Pulsed Doppler evaluation of both ovaries demonstrates normal
low-resistance arterial and venous waveforms.

Other findings

No free fluid.
IMPRESSION: 1. Normal appearance of the uterus and ovaries.
2. No evidence for torsion.

## 2014-05-18 IMAGING — US US ABDOMEN COMPLETE
1 series · 14 of 25 positions shown · non-contrast
Comparison: None.

CLINICAL DATA: Right upper quadrant abdominal pain with nausea.

EXAM:
ULTRASOUND ABDOMEN COMPLETE

[Series 1: us abdomen complete · 0.25mm/px · 14 of 63 slices shown]
[im 1/63]
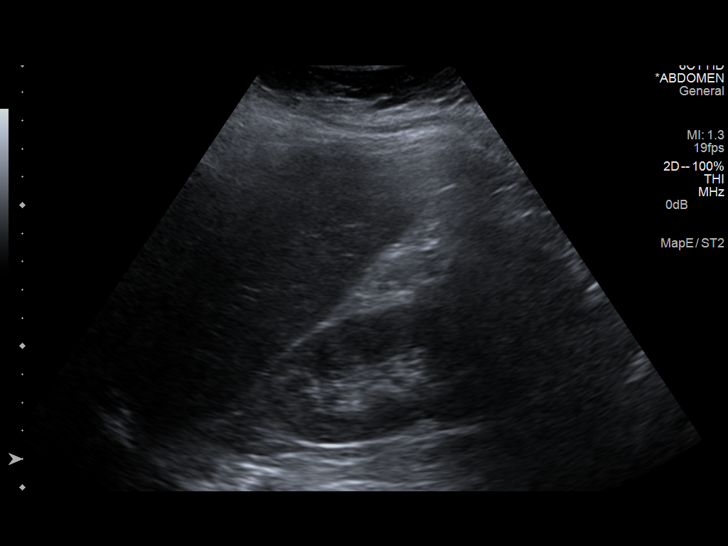
[im 6/63]
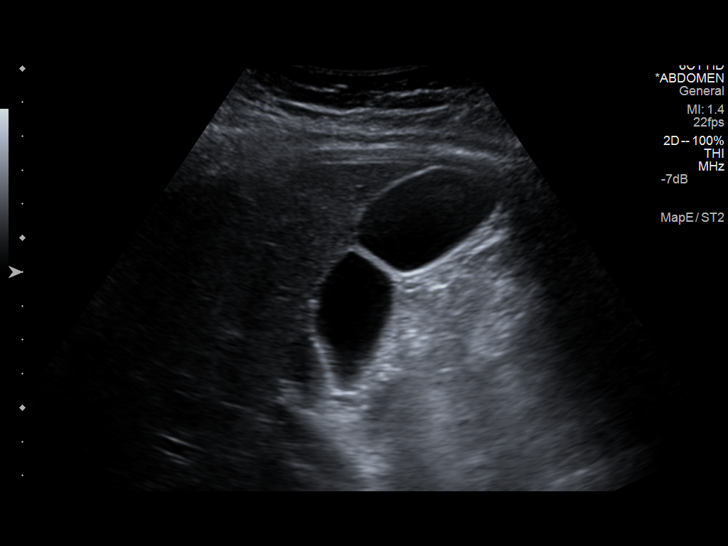
[im 11/63]
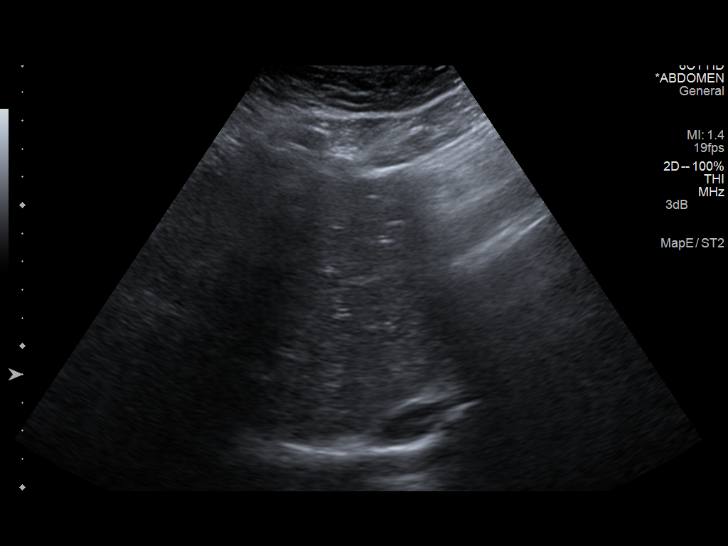
[im 16/63]
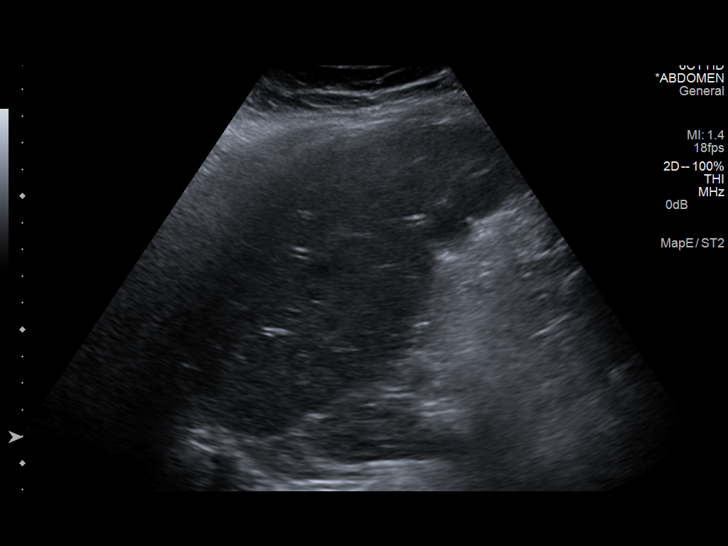
[im 21/63]
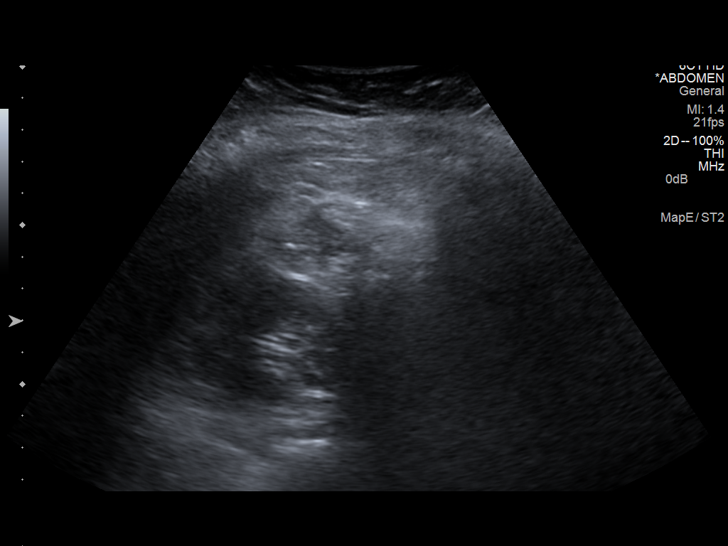
[im 24/63]
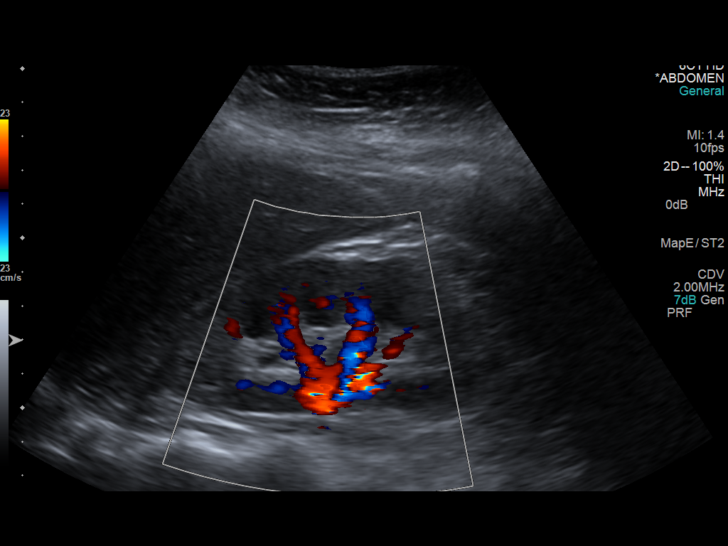
[im 29/63]
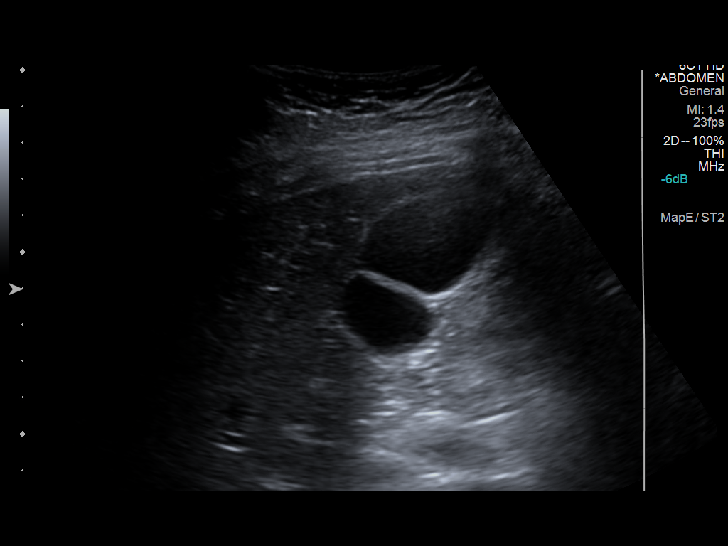
[im 34/63]
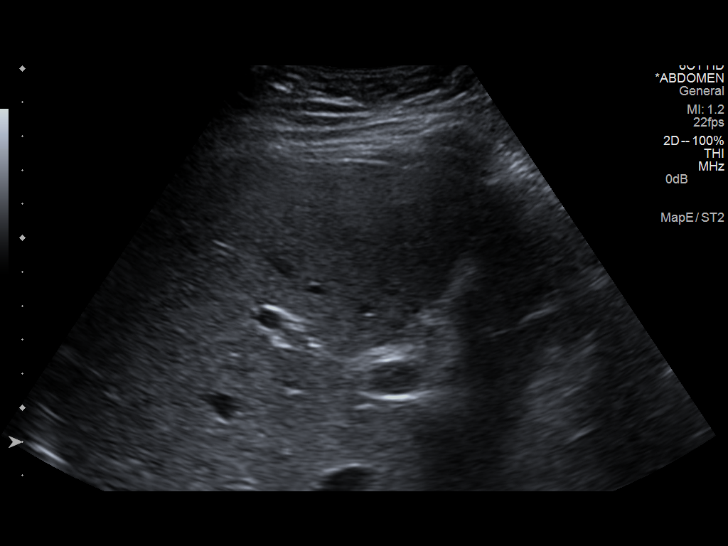
[im 39/63]
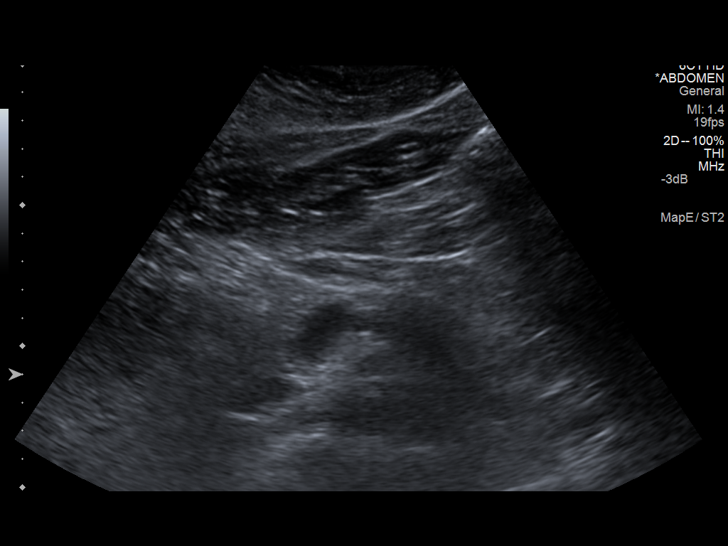
[im 42/63]
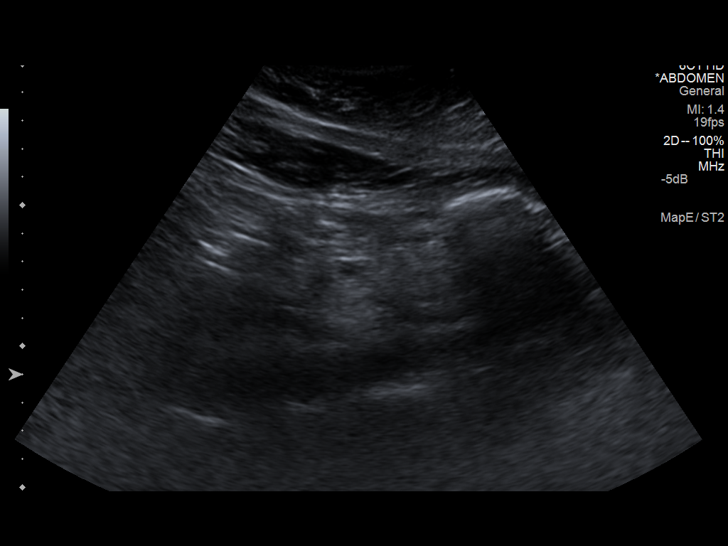
[im 47/63]
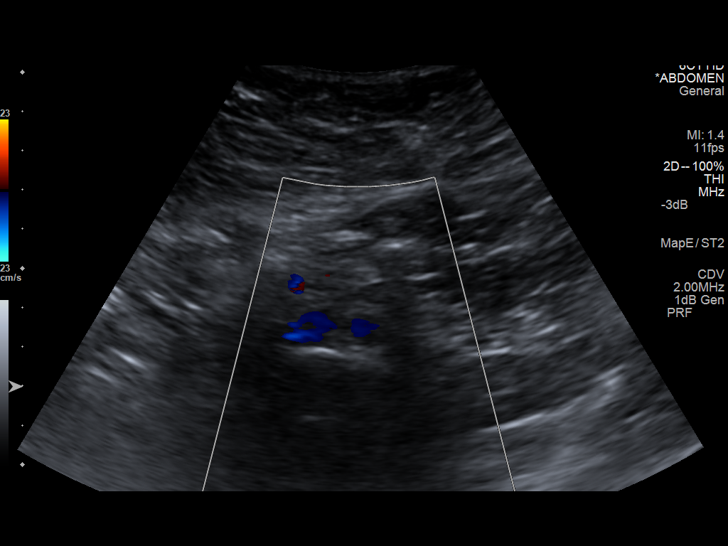
[im 52/63]
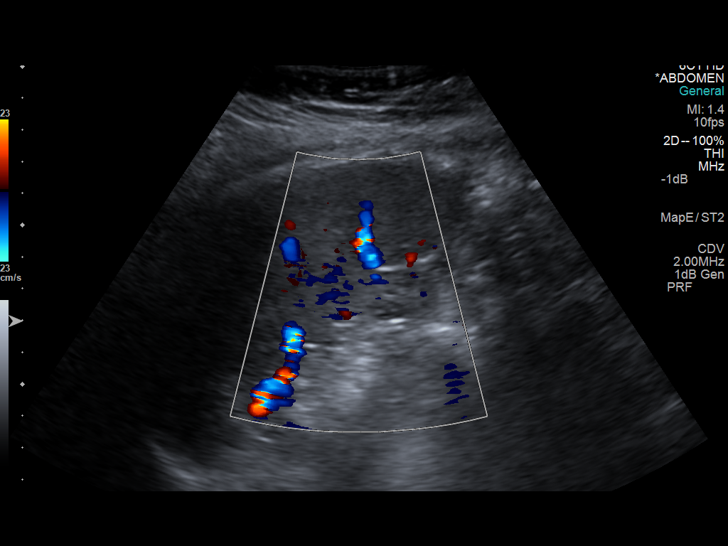
[im 57/63]
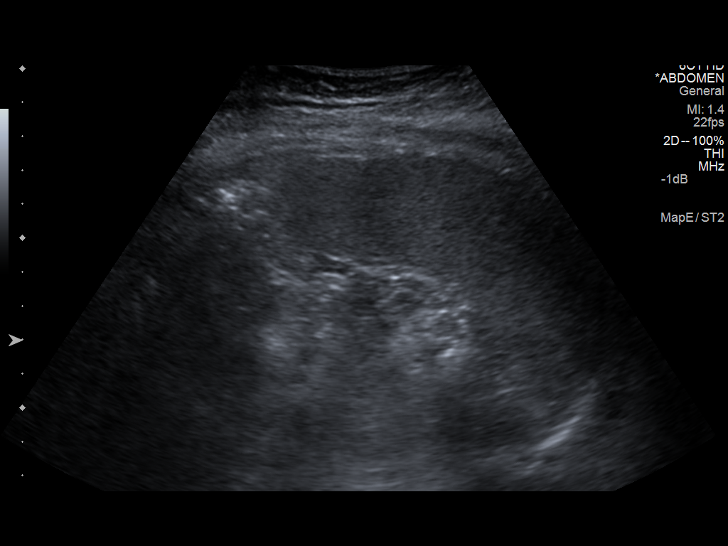
[im 63/63]
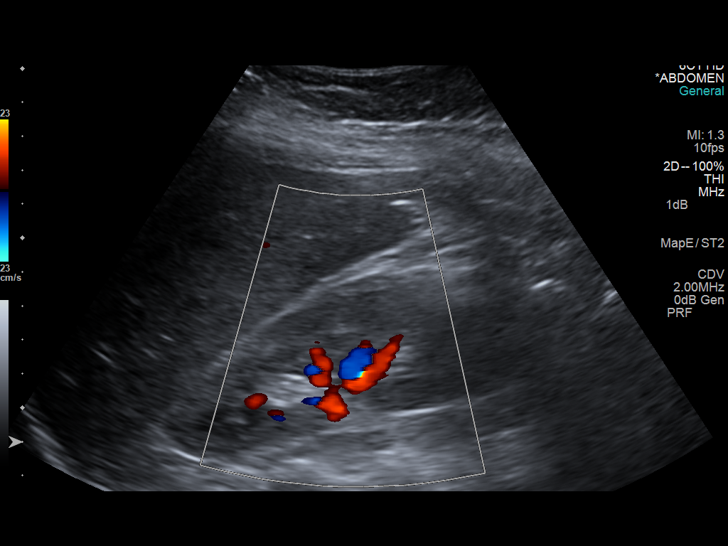

[14 of 25 positions shown; findings below may reference images not displayed]

FINDINGS: Gallbladder

No gallstones or wall thickening visualized. No sonographic Murphy
sign noted.

Common bile duct

Diameter: 3.8 mm. No evidence of an intraductal calculus.

Liver

No focal lesion identified. The left lobe is suboptimally
visualized.

IVC

No abnormality visualized. The intrahepatic IVC is not well seen.

Pancreas

Visualized portion unremarkable.

Spleen

Size and appearance within normal limits.

Right Kidney

Length: 11.3 cm. Echogenicity within normal limits. No mass or
hydronephrosis visualized.

Left Kidney

Length: 10.8 cm. Echogenicity within normal limits. No mass or
hydronephrosis visualized.

Abdominal aorta

No aneurysm visualized. The proximal aorta is suboptimally
visualized.
IMPRESSION: No acute abdominal findings. Portions of the abdomen are obscured by
bowel gas.

## 2015-02-26 ENCOUNTER — Other Ambulatory Visit: Payer: Self-pay | Admitting: Family Medicine

## 2015-02-26 ENCOUNTER — Other Ambulatory Visit (HOSPITAL_COMMUNITY)
Admission: RE | Admit: 2015-02-26 | Discharge: 2015-02-26 | Disposition: A | Discharge status: Home / Self Care | Payer: BLUE CROSS/BLUE SHIELD | Source: Ambulatory Visit | Attending: Family Medicine | Admitting: Family Medicine

## 2015-02-26 DIAGNOSIS — Z01419 Encounter for gynecological examination (general) (routine) without abnormal findings: Secondary | ICD-10-CM | POA: Insufficient documentation

## 2015-02-28 LAB — CYTOLOGY - PAP

## 2015-12-31 ENCOUNTER — Ambulatory Visit (INDEPENDENT_AMBULATORY_CARE_PROVIDER_SITE_OTHER): Payer: BLUE CROSS/BLUE SHIELD | Admitting: Cardiology

## 2015-12-31 ENCOUNTER — Encounter (INDEPENDENT_AMBULATORY_CARE_PROVIDER_SITE_OTHER): Payer: Self-pay

## 2015-12-31 ENCOUNTER — Encounter: Payer: Self-pay | Admitting: Cardiology

## 2015-12-31 VITALS — BP 116/74 | HR 123 | Ht 65.0 in | Wt 152.8 lb

## 2015-12-31 DIAGNOSIS — R000 Tachycardia, unspecified: Secondary | ICD-10-CM | POA: Diagnosis not present

## 2015-12-31 DIAGNOSIS — R0600 Dyspnea, unspecified: Secondary | ICD-10-CM

## 2015-12-31 NOTE — Patient Instructions (Signed)
Medication Instructions:  The current medical regimen is effective;  continue present plan and medications.  Testing/Procedures: Your physician has requested that you have an echocardiogram. Echocardiography is a painless test that uses sound waves to create images of your heart. It provides your doctor with information about the size and shape of your heart and how well your heart's chambers and valves are working. This procedure takes approximately one hour. There are no restrictions for this procedure.  Follow-Up: Follow up as needed with Dr Skains.  Thank you for choosing [facility]!!     

## 2015-12-31 NOTE — Progress Notes (Signed)
Cardiology Office Note    Date:  12/31/2015   ID:  Kaitlyn MedicusStephanie Hipwell, DOB 05/18/1990, MRN [id number]  PCP:  PROVIDER NOT IN SYSTEM  Cardiologist:   Donato SchultzMark Tylon Kemmerling, MD     History of Present Illness:  Kaitlyn Alvarez is a 25 y.o. female here for evaluation of worsening palpitations and exertional dyspnea. She's had issues with tachycardia for many years. [place] of a headache as she was going uphill she felt very short of breath, increased heart rates. At baseline, her heart rate is approximately 100 bpm. That moment it was up to as high as 180 bpm. Started to see spots, felt like almost about to  vomit. Sometimes she has palpitations at night and make her sit up.  No known family history of coronary artery disease. She does get frequent Botox for migraines. She is also on Lexapro, Maxalt when necessary, Vyvanse.  An EKG personally reviewed from 12/26/15 shows sinus tachycardia rate 122 bpm with no other abnormalities. Repeat EKG today shows sinus tachycardia rate 123 with no other abnormalities.  TSH ok.    Past Medical History:  Diagnosis Date  . Anxiety   . Migraines     Past Surgical History:  Procedure Laterality Date  . TONSILLECTOMY      Current Medications: Outpatient Medications Prior to Visit  Medication Sig Dispense Refill  . ibuprofen (ADVIL,MOTRIN) 200 MG tablet Take 600 mg by mouth every 6 (six) hours as needed for pain.    Marland Kitchen. ondansetron (ZOFRAN) 4 MG tablet Take 1 tablet (4 mg total) by mouth every 6 (six) hours. 12 tablet 0  . HYDROcodone-acetaminophen (NORCO/VICODIN) 5-325 MG per tablet Take by mouth.    . lidocaine (XYLOCAINE) 2 % solution 200 mg.    . propranolol ER (INDERAL LA) 60 MG 24 hr capsule Take 60 mg by mouth.    . rizatriptan (MAXALT) 10 MG tablet Take 10 mg by mouth.     No facility-administered medications prior to visit.      Allergies:   Patient has no known allergies.   Social History   Social History  . Marital  status: Single    Spouse name: N/A  . Number of children: N/A  . Years of education: N/A   Social History Main Topics  . Smoking status: Never Smoker  . Smokeless tobacco: Never Used  . Alcohol use 1.2 oz/week    2 Shots of liquor per week     Comment: occasionally- 2 drinks a week  . Drug use: No  . Sexual activity: Yes    Birth control/ protection: None   Other Topics Concern  . None   Social History Narrative  . None     Family History:  The patient's family history includes ADD / ADHD in her brother.   ROS:   Please see the history of present illness.    ROS All other systems reviewed and are negative.   PHYSICAL EXAM:   VS:  BP 116/74   Pulse (!) 123   Ht 5\' 5"  (1.651 m)   Wt 152 lb 12.8 oz (69.3 kg)   LMP 12/28/2015   BMI 25.43 kg/m    GEN: Well nourished, well developed, in no acute distress  HEENT: normal  Neck: no JVD, carotid bruits, or masses Cardiac: RRR; no murmurs, rubs, or gallops,no edema  Respiratory:  clear to auscultation bilaterally, normal work of breathing GI: soft, nontender, nondistended, + BS MS: no deformity or atrophy  Skin:  warm and dry, no rash Neuro:  Alert and Oriented x 3, Strength and sensation are intact Psych: euthymic mood, full affect  Wt Readings from Last 3 Encounters:  12/31/15 152 lb 12.8 oz (69.3 kg)      Studies/Labs Reviewed:   EKG:  EKG is ordered today.  The ekg ordered today demonstrates Sinus tachycardia 123 bpm vertical axis. No significant change from prior personally viewed.  Recent Labs: No results found for requested labs within last 8760 hours.   Lipid Panel No results found for: CHOL, TRIG, HDL, CHOLHDL, VLDL, LDLCALC, LDLDIRECT  Additional studies/ records that were reviewed today include:  Prior EKGs, lab work, office notes reviewed    ASSESSMENT:    1. Tachycardia   2. Dyspnea, unspecified type      PLAN:  In order of problems listed above:  Sinus tachycardia  - Baseline heart rate  at rest 123 on EKG today. Quite elevated. She thinks that for the past 4 years she has had elevated heart rates hovering around 100 bpm. She denies any use stimulants other than Vyvanse but she states that her faster heart rate has predated the use of her Vyvanse.  - I will check an echocardiogram to ensure proper structure and function of her heart.  - If reassuring, I would encourage continued conservative management with adequate hydration, salt liberalization, and laying down for instance if she does feel dizzy, lightheaded, nauseous.  - Try to stay away from Sudafed, excessive caffeine use. She does not drink heavy alcohol. Her thyroid function was normal.  - I told her that occasionally we would use beta blockers or calcium channel blockers however we usually maintain conservative nonpharmacologic management. This could be inappropriate sinus tachycardia. Some people are also using off label use of ivabradine.   - Occasionally excessive heart rates usually greater than 1 30 bpm can cause tachycardia-induced cardiomyopathy when duration is several weeks to days. I do not think that this is the case. Once again, we will check echocardiogram. I do not think that she necessarily needs to come off of her ADHD medication.  She will let us know if any worrisome symptoms develop.    Medication Adjustments/Labs and Tests Ordered: Current medicines are reviewed at length with the patient today.  Concerns regarding medicines are outlined above.  Medication changes, Labs and Tests ordered today are listed in the Patient Instructions below. Patient Instructions  Medication Instructions:  The current medical regimen is effective;  continue present plan and medications.  Testing/Procedures: Your physician has requested that you have an echocardiogram. Echocardiography is a painless test that uses sound waves to create images of your heart. It provides your doctor with information about the size and shape of  your heart and how well your heart's chambers and valves are working. This procedure takes approximately one hour. There are no restrictions for this procedure.  Follow-Up: Follow up as needed with Dr Anne FuSkains.  Thank you for choosing [facility]!!    '     Signed, Donato SchultzMark Aoki Wedemeyer, MD  12/31/2015 1:04 PM    [facility] [address], [place], [place]  [address] Phone: [phone]) [phone]; Fax: [phone]

## 2016-01-22 ENCOUNTER — Other Ambulatory Visit: Payer: Self-pay

## 2016-01-22 ENCOUNTER — Ambulatory Visit (HOSPITAL_COMMUNITY): Payer: BLUE CROSS/BLUE SHIELD | Attending: Internal Medicine

## 2016-01-22 DIAGNOSIS — I071 Rheumatic tricuspid insufficiency: Secondary | ICD-10-CM | POA: Diagnosis not present

## 2016-01-22 DIAGNOSIS — R000 Tachycardia, unspecified: Secondary | ICD-10-CM | POA: Diagnosis not present

## 2018-10-04 DIAGNOSIS — Q796 Ehlers-Danlos syndrome, unspecified: Secondary | ICD-10-CM | POA: Insufficient documentation

## 2018-12-30 NOTE — Progress Notes (Signed)
Cardiology Office Note:    Date:  01/01/2019   ID:  Kaitlyn Alvarez, DOB 11/03/1990, MRN [id number]  PCP:  Shirlean MylarWebb, Carol, MD  Cardiologist:  Donato SchultzMark Skains, MD  Electrophysiologist:  None   Referring MD: No ref. provider found   Chief Complaint: cardiac evaluation after recent diagnosis of Ehlers Danlos Syndrome  History of Present Illness:    Kaitlyn Alvarez is a 28 y.o. female with a history of EDS, palpitations, migraines, and anxiety who is followed by Dr. Anne FuSkains and presents today given recent diagnosis of Ehlers Danlos Syndrome.   Patient first seen by Dr. Anne FuSkains in 12/2015 for further evaluation of palpitations. At that time, she reported issues with tachycardia for years. She also noted recent dyspnea and increased heart rates as high as the 180s  [place] She started seeing spots and felt nauseous when rates were that high. Baseline heart rates around 100 bpm. EKG at time of visit showed sinus tachycardia, rate 123 bpm, but no other abnormalities. TSH was normal. Patient on Vyvanse at the time but felt tachycardia predate initiation of Vyvanse. No other stimulants at the time. Echo was ordered and showed LVEF of 55-60%. Conservative management with adequate hydration, salt liberation, and avoidance of Sudafed and excessive caffeine use was recommended. Patient was advised to follow-up as needed and has not been seen since that time.  Patient recently diagnosed with EDS by [facility] August [facility]. Patient has had left upper back pain for the past several months so PCP recommended patient see a Cardiologist for further evaluation given recent diagnosis of EDS. Back pain is primarily in one spot in left upper back. She describes it as a soreness throughout the day but also notes stabbing pain at times. She also notes occasional numbness in that area but denies any radiating numbness/tingling. She reports area of tenderness on right side of chest. She thinks it is  likely costochondritis. No recent activity/injury to account for pain. She states she will sometimes have diffuse chest pain if she goes up several flights of steps but patient states she is not very active at all. No other chest pain. She continues to have palpitations at times but denies any worsening of this. Baseline heart rates in the 100's to 120's. She states she has POTS but has not been officially diagnosed. She notes lightheadedness/dizziness if she stands too quickly. She has previously had syncopal episodes with this but states she hasn't had one in years because she now knows to stand slowly. No shortness of breath or CHF symptoms. No abnormal bleeding.  Family history of EDS but not family history of sudden death or aortic aneurysm.  Past Medical History:  Diagnosis Date   Anxiety    Migraines     Past Surgical History:  Procedure Laterality Date   TONSILLECTOMY      Current Medications: Current Meds  Medication Sig   FLUoxetine (PROZAC) 40 MG capsule Take 40 mg by mouth daily.   OnabotulinumtoxinA (BOTOX IM) Inject into the muscle. For migraines   ondansetron (ZOFRAN) 4 MG tablet Take 4 mg by mouth as needed for nausea or vomiting.   rizatriptan (MAXALT) 10 MG tablet Take 10 mg by mouth as needed for migraine. May repeat in 2 hours if needed   TEMAZEPAM PO Take 10 mg by mouth daily.   VYVANSE 60 MG capsule Take 70 mg by mouth daily.    [DISCONTINUED] ibuprofen (ADVIL,MOTRIN) 200 MG tablet Take 600  mg by mouth every 6 (six) hours as needed for pain.     Allergies:   Patient has no known allergies.   Social History   Socioeconomic History   Marital status: Single    Spouse name: Not on file   Number of children: Not on file   Years of education: Not on file   Highest education level: Not on file  Occupational History   Not on file  Social Needs   Financial resource strain: Not on file   Food insecurity    Worry: Not on file    Inability: Not on  file   Transportation needs    Medical: Not on file    Non-medical: Not on file  Tobacco Use   Smoking status: Never Smoker   Smokeless tobacco: Never Used  Substance and Sexual Activity   Alcohol use: Yes    Alcohol/week: 2.0 standard drinks    Types: 2 Shots of liquor per week    Comment: occasionally- 2 drinks a week   Drug use: No   Sexual activity: Yes    Birth control/protection: None  Lifestyle   Physical activity    Days per week: Not on file    Minutes per session: Not on file   Stress: Not on file  Relationships   Social connections    Talks on phone: Not on file    Gets together: Not on file    Attends religious service: Not on file    Active member of club or organization: Not on file    Attends meetings of clubs or organizations: Not on file    Relationship status: Not on file  Other Topics Concern   Not on file  Social History Narrative   Not on file     Family History: The patient's family history includes ADD / ADHD in her brother.  ROS:   Please see the history of present illness.    All other systems reviewed and are negative.  EKGs/Labs/Other Studies Reviewed:    The following studies were reviewed today:  Echocardiogram 01/22/2016: Study Conclusions: - Left ventricle: The cavity size was normal. Wall thickness was normal. Systolic function was normal. The estimated ejection fraction was in the range of 55% to 60%. The study is not technically sufficient to allow evaluation of LV diastolic function.  EKG:  EKG ordered today. EKG personally reviewed and demonstrates sinus tachycardia, rate 105 bpm, with prolonged QTc of 475 ms (469 ms on tracing in 2017). No acute ST/T changes.  Recent Labs: No results found for requested labs within last 8760 hours.  Recent Lipid Panel No results found for: CHOL, TRIG, HDL, CHOLHDL, VLDL, LDLCALC, LDLDIRECT  Physical Exam:    Vital Signs: BP 110/70    Pulse (!) 105    Ht 5\' 5"  (1.651 m)    Wt 193  lb 6.4 oz (87.7 kg)    SpO2 98%    BMI 32.18 kg/m     Wt Readings from Last 3 Encounters:  01/01/19 193 lb 6.4 oz (87.7 kg)  12/31/15 152 lb 12.8 oz (69.3 kg)     General: 28 y.o. obese female resting in no acute distress. HEENT: Normocephalic and atraumatic. Sclera clear. EOMs intact. Neck: Supple. No carotid bruits. No JVD. Heart: RRR. Distinct S1 and S2. No murmurs, gallops, or rubs. Radial and posterior tibial pulses 2+ and equal bilaterally. Lungs: No increased work of breathing. Clear to ausculation bilaterally. No wheezes, rhonchi, or rales.  Abdomen: Soft, non-distended,  and non-tender to palpation. Bowel sounds present. MSK: Normal strength and tone for age. Extremities: No lower extremity edema.    Skin: Warm and dry. Neuro: Alert and oriented x3. No focal deficits. Psych: Normal affect. Responds appropriately.   Assessment:    1. Ehlers-Danlos syndrome   2. Palpitations   3. Left-sided thoracic back pain, unspecified chronicity   4. Chest pain, unspecified type   5. 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 Redmond Pulling at [facility]. No family history of sudden death or aortic aneurysms. Discussed with DOD (Dr. Tamala Julian) - will order Echo for signs of valvular disease and chest/abdominal/pelvic MRA for evaluation of aorta to have as baseline. Will check BMET prior to MRA.  Palpitations Patient continues to have occasional palpitations. Baseline heart rates in the 100's to 120's. Mostly asymptomatic with this but some lightheadedness/dizziness with standing too quickly. Patient states she has POTS syndrome but has not been officially diagnosed. EKG shows sinus tachycardia with rate of 105 bpm. Continue conservative management at this time with adequate hydration, salt liberalization, and laying down for instance if she does feel dizzy, lightheaded, nauseous as well as avoiding excess caffeine. If MRA shows  aortic aneurysm, likely will need to add low dose beta-blocker. Vyvanse likely contributing but patient previously said that tachycardia started before she was put on Vyvanse.  Back/Chest Pain Patient reports one area of upper left back pain and one area of upper right chest pain reproducible with palpation on exam. Suspect musculoskeletal in nature. No acute changes on EKG. She notes occasional diffuse chest pain if she goes up multiple flights of step. However, she states she is not very active at baseline. Given age, low suspicion for CAD. Patient advised to notify us if pain with activity continues/worsens. Educated patient on importance of being evaluated in the ED if she ever has sudden onset of significant back/chest pain given history of EDS.  Prolonged QTc QTc mildly prolonged at 475 ms on EKG today. Stable from 469 ms on EKG in 2017. Patient on Prozac and Rizatriptan which can prolong QTc. She also has PRN Zofran but states she rarely needs this. Advised patient to limit use of Zofran. Would avoid other QTc prolonging medications. May need to be cautious with Prozac and Rizatriptan as well. Recommend continuing to monitor QTc while on these meds.   Disposition: Will ultimately need follow-up with Dr. Marlou Porch. Timing of this will depending on results from Echo and MRA.   Medication Adjustments/Labs and Tests Ordered: Current medicines are reviewed at length with the patient today.  Concerns regarding medicines are outlined above.  Orders Placed This Encounter  Procedures   MR ANGIO CHEST W WO CONTRAST   MR ANGIO ABDOMEN W WO CONTRAST   MR ANGIO PELVIS W WO CONTRAST   Basic Metabolic Panel (BMET)   EKG 12-Lead   ECHOCARDIOGRAM COMPLETE   No orders of the defined types were placed in this encounter.   Patient Instructions  Medication Instructions:  Your physician recommends that you continue on your current medications as directed. Please refer to the Current Medication list  given to you today.  *If you need a refill on your cardiac medications before your next appointment, please call your pharmacy*  Lab Work: Your physician recommends that you return for lab work BMET depends on when test are scheduled.   If you have labs (blood work) drawn today and your  tests are completely normal, you will receive your results only by:  MyChart Message (if you have MyChart) OR  A paper copy in the mail If you have any lab test that is abnormal or we need to change your treatment, we will call you to review the results.  Testing/Procedures: Marjie Skiff, PA recommends you have MRA of chest,abdomen, pelvis.   Your physician has requested that you have an echocardiogram. Echocardiography is a painless test that uses sound waves to create images of your heart. It provides your doctor with information about the size and shape of your heart and how well your hearts chambers and valves are working. This procedure takes approximately one hour. There are no restrictions for this procedure.    Follow-Up: At [facility], you and your health needs are our priority.  As part of our continuing mission to provide you with exceptional heart care, we have created designated Provider Care Teams.  These Care Teams include your primary Cardiologist (physician) and Advanced Practice Providers (APPs -  Physician Assistants and Nurse Practitioners) who all work together to provide you with the care you need, when you need it.  Your next appointment:   Will let you know what happens after all test are done.       Other Instructions Limit use of Zofran or other medications that prolong QTC.      Signed, Corrin Parker, PA-C  01/01/2019 5:23 PM    [facility]

## 2019-01-01 ENCOUNTER — Encounter: Payer: Self-pay | Admitting: Student

## 2019-01-01 ENCOUNTER — Other Ambulatory Visit: Payer: Self-pay

## 2019-01-01 ENCOUNTER — Ambulatory Visit: Payer: BC Managed Care – PPO | Admitting: Student

## 2019-01-01 ENCOUNTER — Encounter (INDEPENDENT_AMBULATORY_CARE_PROVIDER_SITE_OTHER): Payer: Self-pay

## 2019-01-01 VITALS — BP 110/70 | HR 105 | Ht 65.0 in | Wt 193.4 lb

## 2019-01-01 DIAGNOSIS — R079 Chest pain, unspecified: Secondary | ICD-10-CM | POA: Diagnosis not present

## 2019-01-01 DIAGNOSIS — Q796 Ehlers-Danlos syndrome, unspecified: Secondary | ICD-10-CM

## 2019-01-01 DIAGNOSIS — R002 Palpitations: Secondary | ICD-10-CM | POA: Diagnosis not present

## 2019-01-01 DIAGNOSIS — M546 Pain in thoracic spine: Secondary | ICD-10-CM

## 2019-01-01 DIAGNOSIS — R9431 Abnormal electrocardiogram [ECG] [EKG]: Secondary | ICD-10-CM

## 2019-01-01 NOTE — Patient Instructions (Signed)
Medication Instructions:  Your physician recommends that you continue on your current medications as directed. Please refer to the Current Medication list given to you today.  *If you need a refill on your cardiac medications before your next appointment, please call your pharmacy*  Lab Work: Your physician recommends that you return for lab work BMET depends on when test are scheduled.   If you have labs (blood work) drawn today and your tests are completely normal, you will receive your results only by: Marland Kitchen MyChart Message (if you have MyChart) OR . A paper copy in the mail If you have any lab test that is abnormal or we need to change your treatment, we will call you to review the results.  Testing/Procedures: Sande Rives, PA recommends you have MRA of chest,abdomen, pelvis.   Your physician has requested that you have an echocardiogram. Echocardiography is a painless test that uses sound waves to create images of your heart. It provides your doctor with information about the size and shape of your heart and how well your heart's chambers and valves are working. This procedure takes approximately one hour. There are no restrictions for this procedure.    Follow-Up: At [facility], you and your health needs are our priority.  As part of our continuing mission to provide you with exceptional heart care, we have created designated Provider Care Teams.  These Care Teams include your primary Cardiologist (physician) and Advanced Practice Providers (APPs -  Physician Assistants and Nurse Practitioners) who all work together to provide you with the care you need, when you need it.  Your next appointment:   Will let you know what happens after all test are done.       Other Instructions Limit use of Zofran or other medications that prolong QTC.

## 2019-01-02 ENCOUNTER — Telehealth: Payer: Self-pay | Admitting: Student

## 2019-01-02 NOTE — Telephone Encounter (Signed)
I am out of the office today, but Cecille Rubin discussed plan with Dr. Marlou Porch who agreed with the original plan for MRAs.

## 2019-01-02 NOTE — Telephone Encounter (Signed)
New message     Phone call from [facility] from central scheduling regarding MRI test scheduled yesterday.  She said the tech told her to call us and tell us that patient should have been scheduled CT angio chest, CT angio abdomen and CT angio pelvis to get results provider is looking for and it will take half the time.  If ok, please put in new orders for CT and let me know and I will call central scheduling to reschedule test.  If any questions, call [facility] directly.

## 2019-01-09 ENCOUNTER — Other Ambulatory Visit (HOSPITAL_COMMUNITY): Payer: BC Managed Care – PPO

## 2019-01-11 ENCOUNTER — Ambulatory Visit (HOSPITAL_COMMUNITY): Payer: BC Managed Care – PPO | Attending: Student

## 2019-01-11 ENCOUNTER — Other Ambulatory Visit: Payer: Self-pay

## 2019-01-11 DIAGNOSIS — Q796 Ehlers-Danlos syndrome, unspecified: Secondary | ICD-10-CM | POA: Insufficient documentation

## 2019-01-12 ENCOUNTER — Telehealth: Payer: Self-pay

## 2019-01-12 NOTE — Telephone Encounter (Signed)
Notes recorded by Frederik Schmidt, RN on 01/12/2019 at 8:12 AM EST  The patient has been notified of the Echo result and verbalized understanding. All questions (if any) were answered.  Frederik Schmidt, RN 01/12/2019 8:12 AM

## 2019-01-12 NOTE — Telephone Encounter (Signed)
-----   Message from Darreld Mclean, [place] sent at 01/12/2019  6:50 AM EST ----- Please notify patient of results: Echo looks good. Normal pumping function of the heart. Minimal/trivial leaking of tricuspid valve but nothing significant. All other valves look good. Aortic root is normal. Continue with current plan.  Thank you!

## 2019-01-23 ENCOUNTER — Other Ambulatory Visit: Payer: Self-pay | Admitting: Family Medicine

## 2019-01-23 ENCOUNTER — Other Ambulatory Visit (HOSPITAL_COMMUNITY)
Admission: RE | Admit: 2019-01-23 | Discharge: 2019-01-23 | Disposition: A | Discharge status: Home / Self Care | Payer: BC Managed Care – PPO | Source: Ambulatory Visit | Attending: Family Medicine | Admitting: Family Medicine

## 2019-01-23 DIAGNOSIS — Z01411 Encounter for gynecological examination (general) (routine) with abnormal findings: Secondary | ICD-10-CM | POA: Diagnosis not present

## 2019-01-24 ENCOUNTER — Ambulatory Visit (HOSPITAL_COMMUNITY): Payer: BC Managed Care – PPO

## 2019-01-24 LAB — CYTOLOGY - PAP: Diagnosis: NEGATIVE

## 2019-02-26 ENCOUNTER — Ambulatory Visit (HOSPITAL_COMMUNITY): Payer: BC Managed Care – PPO

## 2019-05-07 ENCOUNTER — Ambulatory Visit (HOSPITAL_COMMUNITY): Payer: BC Managed Care – PPO

## 2019-05-07 ENCOUNTER — Other Ambulatory Visit: Payer: BC Managed Care – PPO

## 2019-07-12 ENCOUNTER — Ambulatory Visit (HOSPITAL_COMMUNITY): Payer: BC Managed Care – PPO

## 2019-07-12 ENCOUNTER — Ambulatory Visit (HOSPITAL_COMMUNITY): Admission: RE | Admit: 2019-07-12 | Payer: BC Managed Care – PPO | Source: Ambulatory Visit

## 2019-08-14 NOTE — Progress Notes (Signed)
Spoke w/ [employer] about logistics of all three exams being performed at same time. Per Interventionalist, told to split up exams. Called patient about change to exams, patient cannot make appt tomorrow. Patient made aware of change, will let scheduling know to split exam into two exams on separate days.

## 2019-08-15 ENCOUNTER — Ambulatory Visit (HOSPITAL_COMMUNITY): Payer: BC Managed Care – PPO

## 2019-08-15 ENCOUNTER — Ambulatory Visit (HOSPITAL_COMMUNITY)
Admission: RE | Admit: 2019-08-15 | Discharge: 2019-08-15 | Disposition: A | Discharge status: Home / Self Care | Payer: BC Managed Care – PPO | Source: Ambulatory Visit | Attending: Student | Admitting: Student

## 2019-10-10 ENCOUNTER — Ambulatory Visit (HOSPITAL_COMMUNITY): Payer: BC Managed Care – PPO

## 2019-10-17 ENCOUNTER — Ambulatory Visit (HOSPITAL_COMMUNITY): Payer: BC Managed Care – PPO

## 2019-12-24 ENCOUNTER — Ambulatory Visit (HOSPITAL_COMMUNITY): Payer: BC Managed Care – PPO

## 2019-12-28 ENCOUNTER — Ambulatory Visit (HOSPITAL_COMMUNITY): Payer: BC Managed Care – PPO

## 2020-02-26 ENCOUNTER — Ambulatory Visit (HOSPITAL_COMMUNITY): Payer: BC Managed Care – PPO

## 2020-03-04 ENCOUNTER — Other Ambulatory Visit: Payer: Self-pay

## 2020-03-04 ENCOUNTER — Ambulatory Visit: Payer: BC Managed Care – PPO | Attending: Family Medicine | Admitting: Physical Therapy

## 2020-03-04 ENCOUNTER — Encounter: Payer: Self-pay | Admitting: Physical Therapy

## 2020-03-04 DIAGNOSIS — M357 Hypermobility syndrome: Secondary | ICD-10-CM | POA: Diagnosis present

## 2020-03-04 DIAGNOSIS — M62830 Muscle spasm of back: Secondary | ICD-10-CM | POA: Diagnosis present

## 2020-03-04 DIAGNOSIS — M6281 Muscle weakness (generalized): Secondary | ICD-10-CM | POA: Insufficient documentation

## 2020-03-04 DIAGNOSIS — M546 Pain in thoracic spine: Secondary | ICD-10-CM | POA: Diagnosis present

## 2020-03-04 NOTE — Patient Instructions (Signed)
Access Code: [email or web address] URL: [email or web address] Date: 03/04/2020 Prepared by: Raynelle Fanning  Exercises Thoracic Extension Mobilization on Foam Roll - 2 x daily - 7 x weekly - 2 sets - 5 reps Seated Scapular Retraction - 1 x daily - 7 x weekly - 1-3 sets - 10 reps - 2-3 sec hold Supine ITB Stretch with Strap - 2 x daily - 7 x weekly - 1 sets - 3 reps - 60 sec hold Supine Hamstring Stretch with Strap - 2 x daily - 7 x weekly - 1 sets - 3 reps - 60 sec hold  Patient Education Trigger Point Dry Needling
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
## 2020-03-18 ENCOUNTER — Other Ambulatory Visit: Payer: Self-pay

## 2020-03-18 ENCOUNTER — Ambulatory Visit: Payer: BC Managed Care – PPO | Admitting: Physical Therapy

## 2020-03-18 ENCOUNTER — Encounter: Payer: Self-pay | Admitting: Physical Therapy

## 2020-03-18 DIAGNOSIS — M357 Hypermobility syndrome: Secondary | ICD-10-CM | POA: Diagnosis not present

## 2020-03-18 DIAGNOSIS — M62830 Muscle spasm of back: Secondary | ICD-10-CM

## 2020-03-18 DIAGNOSIS — M546 Pain in thoracic spine: Secondary | ICD-10-CM

## 2020-03-18 DIAGNOSIS — M6281 Muscle weakness (generalized): Secondary | ICD-10-CM

## 2020-03-18 NOTE — Therapy (Signed)
[facility] [address], [address] [place], [place], [address] Phone: [phone]   Fax:  [phone]  Physical Therapy Treatment  Patient Details  Name: Kaitlyn Alvarez MRN: [id number] Date of Birth: [birth year] Referring Provider (PT): Shirlean Mylar MD   Encounter Date: 03/18/2020   PT End of Session - 03/18/20 1018    Visit Number 2    Date for PT Re-Evaluation 04/29/20    Authorization Type BCBS    PT Start Time 1019    PT Stop Time 1106    PT Time Calculation (min) 47 min    Activity Tolerance Patient tolerated treatment well    Behavior During Therapy [facility] for tasks assessed/performed           Past Medical History:  Diagnosis Date  . Anxiety   . Migraines     Past Surgical History:  Procedure Laterality Date  . TONSILLECTOMY      There were no vitals filed for this visit.   Subjective Assessment - 03/18/20 1019    Subjective Patient reports she is getting increased tingling in her left foot with HS stretch.    Pertinent History Ehlers-Danlos Syndrome, anxiety, ADHD, migraines    Patient Stated Goals figure out whats happening with her knee; controlling hyperextension of joints, address back pain    Currently in Pain? Yes    Pain Score 3     Pain Location Back    Pain Orientation Left;Upper    Pain Descriptors / Indicators Burning    Pain Type Acute pain              [facility] PT Assessment - 03/18/20 0001      Functional Tests   Functional tests Squat;Lunges;Single leg stance;Single Leg Squat;Step down      Squat   Comments WNL some weakness      Lunges   Comments right hip weaker      Step Down   Comments right hip weakness; left quad weakness      Single Leg Squat   Comments right hip weaker      Single Leg Stance   Comments left less steady but both over 10 se      ROM / Strength   AROM / PROM / Strength Strength      Strength   Strength Assessment Site Hand    Right/Left hand  Right;Left    Right Hand Gross Grasp --   34/33/30   Right Hand Grip (lbs) 32.3    Left Hand Gross Grasp --   25/30/24   Left Hand Grip (lbs) 26.3                         [facility] Adult PT Treatment/Exercise - 03/18/20 0001      Self-Care   Self-Care Other Self-Care Comments   see education     Exercises   Exercises Knee/Hip      Knee/Hip Exercises: Stretches   Passive Hamstring Stretch Limitations nerve glide seated with forward head and rounded trunk: ankle pumps 3x 5 sec hold increased sx; 10 ankle pumps less sx. Attempted various glides in sitting and supine.    Gastroc Stretch Both;1 rep;10 seconds    Gastroc Stretch Limitations no restrictions      Knee/Hip Exercises: Standing   Step Down Both;5 reps;Hand Hold: 2;Step Height: 6"    Step Down Limitations left quad weakness; right hip weakness    Functional Squat 15 reps  Lunge Walking - Round Trips split squat x 5 ea. Lt WNL; Rt some weakness; SL squat x 5 ea    SLS bil > 10 sec; left less stable      Manual Therapy   Manual Therapy Soft tissue mobilization;Joint mobilization;Taping    Joint Mobilization gd II to mid thoracic    Soft tissue mobilization to left lumbar and thoracic paraspinals    Kinesiotex Ligament Correction      Kinesiotix   Ligament Correction taped to prevent lateral glide of left patella                  PT Education - 03/18/20 1706    Education Details self MFR with ball to left QL and mid thoracic paraspinals    Person(s) Educated Patient    Methods Explanation;Demonstration    Comprehension Verbalized understanding;Returned demonstration            PT Short Term Goals - 03/04/20 1258      PT SHORT TERM GOAL #1   Title Ind with initial HEP    Time 43    Period Weeks    Status New    Target Date 03/25/20      PT SHORT TERM GOAL #2   Title decreased back pain by >= 30% with ADLS    Time 3    Period Weeks    Status New             PT Long Term Goals -  03/04/20 1259      PT LONG TERM GOAL #1   Title Ind with advanced HEP for stabilization and strengthening of joints.    Time 8    Period Weeks    Status New    Target Date 04/29/20      PT LONG TERM GOAL #2   Title Patient to report decreased instability in left knee with ADLS by 50% or more.    Time 8    Period Weeks    Status New      PT LONG TERM GOAL #3   Title Patient able to perform ADLS with at least 75% less pain in the back.    Time 8    Period Weeks    Status New      PT LONG TERM GOAL #4   Title Patient to demo improved wrist strength to >= 4+/5 with improved ability to lift and carry items.    Time 8    Period Weeks    Status New      PT LONG TERM GOAL #5   Title Patient to demo improved standing posture without hyperextending bil knees.    Time 8    Period Weeks    Status New                 Plan - 03/18/20 1715    Clinical Impression Statement Further assessment was completed today. Patient has weakness bil in grip strength and demonstrates right hip weakness and left quad weakness with functional testing. She is still very tender in left thoracic paraspinals and in the left QL as well. She would benefit from DN to these areas since stretches are difficult to reach with her hypermobiity. HS stretch produced radicular sx in left LE, so a nerve glide was added instead. And HS stretch was modified to seated with a towel under the knee to avoid hyperextension. Patient going on vacation and will return after.    Comorbidities Ehlers-Danlos Syndrome, anxiety,  ADHD, migraines, obesity    PT Frequency 2x / week    PT Duration 8 weeks    PT Treatment/Interventions ADLs/Self Care Home Management;Aquatic Therapy;Cryotherapy;Electrical Stimulation;Iontophoresis 4mg /ml Dexamethasone;Moist Heat;Neuromuscular re-education;Therapeutic exercise;Therapeutic activities;Patient/family education;Manual techniques;Dry needling;Taping    PT Next Visit Plan add piriformis and  quad stretch, ankle invertor stretch; STW/DN/stim to paraspinals and left QL; assess tape, work on wrist and grip strength    PT Home Exercise Plan [email or web address]    Consulted and Agree with Plan of Care Patient           Patient will benefit from skilled therapeutic intervention in order to improve the following deficits and impairments:  Impaired UE functional use,Hypermobility,Pain,Impaired flexibility,Decreased mobility,Decreased strength,Postural dysfunction  Visit Diagnosis: Hypermobility syndrome  Pain in thoracic spine  Muscle weakness (generalized)  Muscle spasm of back     Problem List Patient Active Problem List   Diagnosis Date Noted  . Ehlers-Danlos syndrome 01/01/2019  . Attention deficit hyperactivity disorder (ADHD), predominantly inattentive type 01/03/2014    13/01/2014 PT 03/18/2020, 5:23 PM   [facility] [address], [address] [place], [place], [place] Phone: [phone]   Fax:  [phone]  Name: ACHSAH MCQUADE MRN: Judge Stall Date of Birth: 1990/04/17

## 2020-03-18 NOTE — Patient Instructions (Signed)
Access Code: [email or web address] URL: [email or web address] Date: 03/18/2020 Prepared by: Raynelle Fanning  Exercises Thoracic Extension Mobilization on Foam Roll - 2 x daily - 7 x weekly - 2 sets - 5 reps Seated Scapular Retraction - 1 x daily - 7 x weekly - 1-3 sets - 10 reps - 2-3 sec hold Supine ITB Stretch with Strap - 2 x daily - 7 x weekly - 1 sets - 3 reps - 60 sec hold Supine Hamstring Stretch with Strap - 2 x daily - 7 x weekly - 1 sets - 3 reps - 60 sec hold  Patient Education Trigger Point Dry Needling

## 2020-04-01 ENCOUNTER — Encounter: Payer: Self-pay | Admitting: Physical Therapy

## 2020-04-01 ENCOUNTER — Other Ambulatory Visit: Payer: Self-pay

## 2020-04-01 ENCOUNTER — Ambulatory Visit: Payer: BC Managed Care – PPO | Attending: Family Medicine | Admitting: Physical Therapy

## 2020-04-01 DIAGNOSIS — M62830 Muscle spasm of back: Secondary | ICD-10-CM

## 2020-04-01 DIAGNOSIS — M6281 Muscle weakness (generalized): Secondary | ICD-10-CM | POA: Insufficient documentation

## 2020-04-01 DIAGNOSIS — M546 Pain in thoracic spine: Secondary | ICD-10-CM | POA: Diagnosis present

## 2020-04-01 DIAGNOSIS — M357 Hypermobility syndrome: Secondary | ICD-10-CM

## 2020-04-01 NOTE — Patient Instructions (Addendum)
Plantar Flexion: Isometric   Press left foot into ball or rolled pillow against wall. Hold _10___ seconds. Relax. Repeat __5__ times per set. Do ____ sets per session. Do ___2 sessions per day.  http://[email or web address].[email or web address]/0   Inversion: Isometric   Press inner borders of feet into ball or rolled pillow between feet. Hold _10___ seconds. Relax. Repeat __5_ times per set. Do ____ sets per session. Do __2__ sessions per day.  http://[email or web address].[email or web address]   Pillow Squeeze (Isometric Dorsiflexion)   Lying with pillow between feet, one foot on top, squeeze feet together, bringing bottom foot up while pushing top one down. Hold 10____ seconds. Repeat with other foot on top. Repeat __5__ times. Do __2__ sessions per day.  [email or web address]   Eversion: Isometric   Press outer border of right foot into ball or rolled pillow against wall. Hold _10___ seconds. Relax. Repeat __5_ times per set. Do ____ sets per session. Do __2__ sessions per day.  http://orth.[email or web address].us/4   Copyright  VHI. All rights reserved.   Access Code: [email or web address] URL: [email or web address] Date: 04/01/2020 Prepared by: Raynelle Fanning  Exercises Thoracic Extension Mobilization on Foam Roll - 2 x daily - 7 x weekly - 2 sets - 5 reps Seated Scapular Retraction - 1 x daily - 7 x weekly - 1-3 sets - 10 reps - 2-3 sec hold Supine ITB Stretch with Strap - 2 x daily - 7 x weekly - 1 sets - 3 reps - 60 sec hold Supine Hamstring Stretch with Strap - 2 x daily - 7 x weekly - 1 sets - 3 reps - 60 sec hold Seated Figure 4 Piriformis Stretch - 2 x daily - 7 x weekly - 3 reps - 1 sets - 30-60 sec hold  Patient Education Trigger Point Dry Needling

## 2020-04-01 NOTE — Therapy (Signed)
[facility] [address] [place], [place], [address] Phone: [phone]   Fax:  [phone]  Physical Therapy Treatment  Patient Details  Name: Kaitlyn Alvarez MRN: [id number] Date of Birth: 1990/04/02 Referring Provider (PT): Shirlean Mylar MD   Encounter Date: 04/01/2020   PT End of Session - 04/01/20 0837    Visit Number 3    Date for PT Re-Evaluation 04/29/20    Authorization Type BCBS    PT Start Time 0837    PT Stop Time 0926    PT Time Calculation (min) 49 min    Activity Tolerance Patient tolerated treatment well    Behavior During Therapy [facility] for tasks assessed/performed           Past Medical History:  Diagnosis Date  . Anxiety   . Migraines     Past Surgical History:  Procedure Laterality Date  . TONSILLECTOMY      There were no vitals filed for this visit.   Subjective Assessment - 04/01/20 0839    Subjective While in [place] patient experienced bil swelling in feet. Her back has been hurting intermittently. When she came home she stepped on a pill bottle on the floor with the left foot and has been having pain for about a week. Her knee is still feeling a little unstable. The tape helped some.    Pertinent History Ehlers-Danlos Syndrome, anxiety, ADHD, migraines    Patient Stated Goals figure out whats happening with her knee; controlling hyperextension of joints, address back pain    Currently in Pain? Yes    Pain Score 5     Pain Location Foot    Pain Orientation Left    Pain Descriptors / Indicators Sore    Pain Type Acute pain    Aggravating Factors  going down stairs is sharper pain              [facility] PT Assessment - 04/01/20 0001      Strength   Overall Strength Comments left ankle inv 5/5, ever 4+/5 some pain, DF 5/5, PF strong with manual resistance      Palpation   Palpation comment tenderness of left 5th metatarsal and with 4/5 metatarsal mobility test; also tender in left  ext digitorum brevis                         [facility] Adult PT Treatment/Exercise - 04/01/20 0001      Exercises   Exercises Lumbar;Wrist      Lumbar Exercises: Stretches   Piriformis Stretch Right;Left;1 rep;30 seconds    Piriformis Stretch Limitations seated fig 4 with trunk lean      Lumbar Exercises: Prone   Single Arm Raise Right;Left;10 reps    Straight Leg Raise 10 reps    Straight Leg Raises Limitations with pelvic press    Other Prone Lumbar Exercises pelvic press 5x5 sec      Lumbar Exercises: Quadruped   Straight Leg Raise 5 reps    Straight Leg Raises Limitations concern re: wrist stability    Other Quadruped Lumbar Exercises alternating leg lifts in semi-quad position (elbows and knees) x 5 ea      Knee/Hip Exercises: Seated   Other Seated Knee/Hip Exercises left ankle isometrics 10 sec hold x 1 in each direction      Knee/Hip Exercises: Supine   Other Supine Knee/Hip Exercises nerve glide 5 sec on/off x 10 ea bil  Wrist Exercises   Other wrist exercises wrist isometrics 4 way x 10 sec each modifiying as need to keep finger and wrist joints stable                  PT Education - 04/01/20 0931    Education Details HEP    Person(s) Educated Patient    Methods Explanation;Demonstration;Handout    Comprehension Verbalized understanding;Returned demonstration            PT Short Term Goals - 03/04/20 1258      PT SHORT TERM GOAL #1   Title Ind with initial HEP    Time 43    Period Weeks    Status New    Target Date 03/25/20      PT SHORT TERM GOAL #2   Title decreased back pain by >= 30% with ADLS    Time 3    Period Weeks    Status New             PT Long Term Goals - 03/04/20 1259      PT LONG TERM GOAL #1   Title Ind with advanced HEP for stabilization and strengthening of joints.    Time 8    Period Weeks    Status New    Target Date 04/29/20      PT LONG TERM GOAL #2   Title Patient to report decreased  instability in left knee with ADLS by 50% or more.    Time 8    Period Weeks    Status New      PT LONG TERM GOAL #3   Title Patient able to perform ADLS with at least 75% less pain in the back.    Time 8    Period Weeks    Status New      PT LONG TERM GOAL #4   Title Patient to demo improved wrist strength to >= 4+/5 with improved ability to lift and carry items.    Time 8    Period Weeks    Status New      PT LONG TERM GOAL #5   Title Patient to demo improved standing posture without hyperextending bil knees.    Time 8    Period Weeks    Status New                 Plan - 04/01/20 0931    Clinical Impression Statement Patient presents today after two week break from PT due to vacation. She reports decreased pain in her mid back, but new pain in her left ankle after stepping on a bottle. She is tender along 5th metatarsal and has some mild pain with circles. Isometrics were added to HEP. We worked on wrist stabilization with isometrics as well and initiated spinal stabilization. Patient reports her knee is feeling pretty good. She is still having intermittent stability issues.    Comorbidities Ehlers-Danlos Syndrome, anxiety, ADHD, migraines, obesity    Examination-Activity Limitations Carry;Locomotion Level    PT Treatment/Interventions ADLs/Self Care Home Management;Aquatic Therapy;Cryotherapy;Electrical Stimulation;Iontophoresis 4mg /ml Dexamethasone;Moist Heat;Neuromuscular re-education;Therapeutic exercise;Therapeutic activities;Patient/family education;Manual techniques;Dry needling;Taping    PT Next Visit Plan Assess STGs, continue with spinal stab, wrist strengthening (try 1# next visit); low trap strength, knee stability (stairs);  continue with nerve glide;  STW/DN/stim to paraspinals and left QL prn;    PT Home Exercise Plan [email or web address]    Consulted and Agree with Plan of Care Patient  Patient will benefit from skilled therapeutic intervention in order to  improve the following deficits and impairments:  Impaired UE functional use,Hypermobility,Pain,Impaired flexibility,Decreased mobility,Decreased strength,Postural dysfunction  Visit Diagnosis: Hypermobility syndrome  Pain in thoracic spine  Muscle weakness (generalized)  Muscle spasm of back     Problem List Patient Active Problem List   Diagnosis Date Noted  . Ehlers-Danlos syndrome 01/01/2019  . Attention deficit hyperactivity disorder (ADHD), predominantly inattentive type 01/03/2014    Solon Palm PT 04/01/2020, 5:53 PM   [facility] [address] [place], [place], [address] Phone: [phone]   Fax:  [phone]  Name: Kaitlyn Alvarez MRN: [id number] Date of Birth: [birth year]

## 2020-04-04 ENCOUNTER — Ambulatory Visit: Payer: BC Managed Care – PPO | Admitting: Physical Therapy

## 2020-04-04 ENCOUNTER — Other Ambulatory Visit: Payer: Self-pay

## 2020-04-04 ENCOUNTER — Encounter: Payer: Self-pay | Admitting: Physical Therapy

## 2020-04-04 DIAGNOSIS — M357 Hypermobility syndrome: Secondary | ICD-10-CM | POA: Diagnosis not present

## 2020-04-04 DIAGNOSIS — M6281 Muscle weakness (generalized): Secondary | ICD-10-CM

## 2020-04-04 DIAGNOSIS — M546 Pain in thoracic spine: Secondary | ICD-10-CM

## 2020-04-04 DIAGNOSIS — M62830 Muscle spasm of back: Secondary | ICD-10-CM

## 2020-04-04 NOTE — Therapy (Signed)
[facility] [address] [place], [place], [address] Phone: [phone]   Fax:  [phone]  Physical Therapy Treatment  Patient Details  Name: Kaitlyn Alvarez MRN: [id number] Date of Birth: 01/11/1991 Referring Provider (PT): Shirlean Mylar MD   Encounter Date: 04/04/2020   PT End of Session - 04/04/20 0850    Visit Number 4    Date for PT Re-Evaluation 04/29/20    Authorization Type BCBS    PT Start Time [phone]    PT Stop Time 0930    PT Time Calculation (min) 43 min    Activity Tolerance Patient tolerated treatment well    Behavior During Therapy [facility] for tasks assessed/performed           Past Medical History:  Diagnosis Date  . Anxiety   . Migraines     Past Surgical History:  Procedure Laterality Date  . TONSILLECTOMY      There were no vitals filed for this visit.   Subjective Assessment - 04/04/20 0851    Subjective Lt foot sore from her cat getting under fer foot and she rolled it a bit, otherwise feeling ok this AM.    Pertinent History Ehlers-Danlos Syndrome, anxiety, ADHD, migraines    Currently in Pain? Yes    Pain Score 6     Pain Location Foot    Pain Orientation Left    Pain Descriptors / Indicators Sore    Multiple Pain Sites No                             [facility] Adult PT Treatment/Exercise - 04/04/20 0001      Lumbar Exercises: Supine   Clam 10 reps   VC for TA activation   Bent Knee Raise 10 reps    Dead Bug 10 reps      Knee/Hip Exercises: Machines for Strengthening   Cybex Leg Press Seat 7: BilLE 60# x10 70# x10      Knee/Hip Exercises: Standing   Step Down Right;1 set;10 reps;Hand Hold: 0;Step Height: 6"    SLS RT with Lt toe taps to first step 20x    Other Standing Knee Exercises weight shifting on black pad 1 min      Wrist Exercises   Wrist Extension Strengthening;Both;10 reps;Seated;Bar weights/barbell    Bar Weights/Barbell (Wrist Extension) 1 lb     Other wrist exercises wrist isometrics 4 way x 10 sec each modifiying as need to keep finger and wrist joints stable   review                 PT Education - 04/04/20 0935    Education Details HEP: L 1 supine core stabilization    Person(s) Educated Patient    Methods Explanation;Handout;Verbal cues    Comprehension Returned demonstration;Verbalized understanding            PT Short Term Goals - 04/04/20 0910      PT SHORT TERM GOAL #1   Title Ind with initial HEP    Time 43    Period Weeks    Status Achieved    Target Date 03/25/20             PT Long Term Goals - 03/04/20 1259      PT LONG TERM GOAL #1   Title Ind with advanced HEP for stabilization and strengthening of joints.    Time 8    Period  Weeks    Status New    Target Date 04/29/20      PT LONG TERM GOAL #2   Title Patient to report decreased instability in left knee with ADLS by 50% or more.    Time 8    Period Weeks    Status New      PT LONG TERM GOAL #3   Title Patient able to perform ADLS with at least 75% less pain in the back.    Time 8    Period Weeks    Status New      PT LONG TERM GOAL #4   Title Patient to demo improved wrist strength to >= 4+/5 with improved ability to lift and carry items.    Time 8    Period Weeks    Status New      PT LONG TERM GOAL #5   Title Patient to demo improved standing posture without hyperextending bil knees.    Time 8    Period Weeks    Status New                 Plan - 04/04/20 6468    Clinical Impression Statement Pt arrives with not much pain. She reports an incidence where her cat got under her foot and she took at stumble so her Lt foot is sore. The foot was aggreavted during single limb stance activites so we kept the standing exercises limited to RTLE only. Pt is interested in returning to the gym some time so we tried he leg press which she performed with no issues. PTA added level 1 supine core exercises to HEp so she can  begin to work more on her stabilization. She does continue to have a "burning sensation" along the back of both legs. We attempted some sciatic nerve flossing but pt felt there was no real effect from it. PTA suggested she may consider trying dry needling next session.    Personal Factors and Comorbidities Comorbidity 3+    Comorbidities Ehlers-Danlos Syndrome, anxiety, ADHD, migraines, obesity    Examination-Activity Limitations Carry;Locomotion Level    Stability/Clinical Decision Making Evolving/Moderate complexity    Rehab Potential Good    PT Frequency 2x / week    PT Duration 8 weeks    PT Treatment/Interventions ADLs/Self Care Home Management;Aquatic Therapy;Cryotherapy;Electrical Stimulation;Iontophoresis 4mg /ml Dexamethasone;Moist Heat;Neuromuscular re-education;Therapeutic exercise;Therapeutic activities;Patient/family education;Manual techniques;Dry needling;Taping    PT Next Visit Plan Consider DN to glutes? Progress core stabilization    PT Home Exercise Plan [email or web address]    Consulted and Agree with Plan of Care Patient           Patient will benefit from skilled therapeutic intervention in order to improve the following deficits and impairments:  Impaired UE functional use,Hypermobility,Pain,Impaired flexibility,Decreased mobility,Decreased strength,Postural dysfunction  Visit Diagnosis: Hypermobility syndrome  Pain in thoracic spine  Muscle weakness (generalized)  Muscle spasm of back     Problem List Patient Active Problem List   Diagnosis Date Noted  . Ehlers-Danlos syndrome 01/01/2019  . Attention deficit hyperactivity disorder (ADHD), predominantly inattentive type 01/03/2014    Makya Phillis, PTA 04/04/2020, 9:54 AM  [facility] [address], [address] [place], [place], [place] Phone: [phone]   Fax:  [phone]  Name: Kaitlyn Alvarez MRN: Judge Stall Date of Birth: [birth year] Access Code:  [email or web address]: [email or web address]: 02/11/2022Prepared by: 06/02/2020 CochranExercises  Thoracic Extension Mobilization on Foam Roll - 2 x daily - 7 x weekly - 2 sets - 5 reps  Seated Scapular Retraction - 1 x daily - 7 x weekly - 1-3 sets - 10 reps - 2-3 sec hold  Supine ITB Stretch with Strap - 2 x daily - 7 x weekly - 1 sets - 3 reps - 60 sec hold  Supine Hamstring Stretch with Strap - 2 x daily - 7 x weekly - 1 sets - 3 reps - 60 sec hold  Seated Figure 4 Piriformis Stretch - 2 x daily - 7 x weekly - 3 reps - 1 sets - 30-60 sec hold  Isometric Wrist Extension Pronated - 1 x daily - 7 x weekly - 1 sets - 5 reps - 10 sec hold  Seated Isometric Wrist Flexion Supinated with Manual Resistance - 1 x daily - 7 x weekly - 1 sets - 5 reps - 10 sec hold  Seated Isometric Wrist Radial Deviation with Manual Resistance - 1 x daily - 7 x weekly - 1 sets - 5 reps - 10 sec hold  Seated Isometric Wrist Ulnar Deviation with Manual Resistance - 1 x daily - 7 x weekly - 1 sets - 5 reps - 10 sec hold  Supine Transversus Abdominis Bracing with Double Leg Fallout - 1 x daily - 7 x weekly - 2 sets - 10 reps  Supine March - 1 x daily - 7 x weekly - 2 sets - 10 reps  Supine Dead Bug with Leg Extension - 1 x daily - 7 x weekly - 1 sets - 10 reps

## 2020-04-08 ENCOUNTER — Other Ambulatory Visit: Payer: Self-pay

## 2020-04-08 ENCOUNTER — Ambulatory Visit: Payer: BC Managed Care – PPO | Admitting: Physical Therapy

## 2020-04-08 DIAGNOSIS — M62830 Muscle spasm of back: Secondary | ICD-10-CM

## 2020-04-08 DIAGNOSIS — M357 Hypermobility syndrome: Secondary | ICD-10-CM

## 2020-04-08 DIAGNOSIS — M546 Pain in thoracic spine: Secondary | ICD-10-CM

## 2020-04-08 DIAGNOSIS — M6281 Muscle weakness (generalized): Secondary | ICD-10-CM

## 2020-04-08 NOTE — Patient Instructions (Signed)
     Trigger Point Dry Needling  . What is Trigger Point Dry Needling (DN)? o DN is a physical therapy technique used to treat muscle pain and dysfunction. Specifically, DN helps deactivate muscle trigger points (muscle knots).  o A thin filiform needle is used to penetrate the skin and stimulate the underlying trigger point. The goal is for a local twitch response (LTR) to occur and for the trigger point to relax. No medication of any kind is injected during the procedure.   . What Does Trigger Point Dry Needling Feel Like?  o The procedure feels different for each individual patient. Some patients report that they do not actually feel the needle enter the skin and overall the process is not painful. Very mild bleeding may occur. However, many patients feel a deep cramping in the muscle in which the needle was inserted. This is the local twitch response.   . How Will I feel after the treatment? o Soreness is normal, and the onset of soreness may not occur for a few hours. Typically this soreness does not last longer than two days.  o Bruising is uncommon, however; ice can be used to decrease any possible bruising.  o In rare cases feeling tired or nauseous after the treatment is normal. In addition, your symptoms may get worse before they get better, this period will typically not last longer than 24 hours.   . What Can I do After My Treatment? o Increase your hydration by drinking more water for the next 24 hours. o You may place ice or heat on the areas treated that have become sore, however, do not use heat on inflamed or bruised areas. Heat often brings more relief post needling. o You can continue your regular activities, but vigorous activity is not recommended initially after the treatment for 24 hours. o DN is best combined with other physical therapy such as strengthening, stretching, and other therapies.    Jesusa Stenerson PT [facility] [address], [address] [place], [place] [address] Phone # [phone] Fax [phone] 

## 2020-04-08 NOTE — Therapy (Signed)
[facility] [address] [place], [place], [address] Phone: [phone]   Fax:  [phone]  Physical Therapy Treatment  Patient Details  Name: Kaitlyn Alvarez MRN: [id number] Date of Birth: [birth year] Referring Provider (PT): Shirlean Mylar MD   Encounter Date: 04/08/2020   PT End of Session - 04/08/20 1704    Visit Number 5    Date for PT Re-Evaluation 04/29/20    Authorization Type BCBS    PT Start Time 1620    PT Stop Time 1656   DN, heat   PT Time Calculation (min) 36 min    Activity Tolerance Patient tolerated treatment well           Past Medical History:  Diagnosis Date  . Anxiety   . Migraines     Past Surgical History:  Procedure Laterality Date  . TONSILLECTOMY      There were no vitals filed for this visit.   Subjective Assessment - 04/08/20 1623    Subjective Left medial scapular pain .  Left knee bothers me and cand be unstable.    Pertinent History Ehlers-Danlos Syndrome, anxiety, ADHD, migraines    Patient Stated Goals figure out whats happening with her knee; controlling hyperextension of joints, address back pain    Currently in Pain? Yes    Pain Score 3     Pain Location Scapula    Pain Orientation Left    Multiple Pain Sites Yes    Pain Score 5    Pain Location Knee    Pain Orientation Left                             [facility] Adult PT Treatment/Exercise - 04/08/20 0001      Self-Care   Self-Care Other Self-Care Comments    Other Self-Care Comments  post needling after care, use of heat, benefit of regular ex      Lumbar Exercises: Seated   Other Seated Lumbar Exercises review of HEP encouraged regular scap retractions      Moist Heat Therapy   Number Minutes Moist Heat 3 Minutes    Moist Heat Location Other (comment)   left scapular region     Manual Therapy   Soft tissue mobilization left rhomboids, levator scap, infraspinatus            Trigger  Point Dry Needling - 04/08/20 0001    Consent Given? Yes    Education Handout Provided Yes    Muscles Treated Head and Neck Levator scapulae    Muscles Treated Upper Quadrant Rhomboids;Infraspinatus    Levator Scapulae Response Palpable increased muscle length    Rhomboids Response Palpable increased muscle length    Infraspinatus Response Palpable increased muscle length                PT Education - 04/08/20 1704    Education Details DN info and after care    Person(s) Educated Patient    Methods Explanation;Handout    Comprehension Verbalized understanding            PT Short Term Goals - 04/04/20 0910      PT SHORT TERM GOAL #1   Title Ind with initial HEP    Time 43    Period Weeks    Status Achieved    Target Date 03/25/20             PT Long Term Goals -  03/04/20 1259      PT LONG TERM GOAL #1   Title Ind with advanced HEP for stabilization and strengthening of joints.    Time 8    Period Weeks    Status New    Target Date 04/29/20      PT LONG TERM GOAL #2   Title Patient to report decreased instability in left knee with ADLS by 50% or more.    Time 8    Period Weeks    Status New      PT LONG TERM GOAL #3   Title Patient able to perform ADLS with at least 75% less pain in the back.    Time 8    Period Weeks    Status New      PT LONG TERM GOAL #4   Title Patient to demo improved wrist strength to >= 4+/5 with improved ability to lift and carry items.    Time 8    Period Weeks    Status New      PT LONG TERM GOAL #5   Title Patient to demo improved standing posture without hyperextending bil knees.    Time 8    Period Weeks    Status New                 Plan - 04/08/20 1646    Clinical Impression Statement The reports multi region and variable pain in several locations.  Discussed DN and determined it may be most beneficial in left periscapular muscle region.  She has a positive response with decreased tender point size and  number following treatment.  She reports she has not had much time to perform her HEP b/c of work recently therefore we did not progress or add anything new today however recommended emphasis on scapular retractions.  Therapist monitoring response throughout treatment session.    Comorbidities Ehlers-Danlos Syndrome, anxiety, ADHD, migraines, obesity    Rehab Potential Good    PT Frequency 2x / week    PT Duration 8 weeks    PT Treatment/Interventions ADLs/Self Care Home Management;Aquatic Therapy;Cryotherapy;Electrical Stimulation;Iontophoresis 4mg /ml Dexamethasone;Moist Heat;Neuromuscular re-education;Therapeutic exercise;Therapeutic activities;Patient/family education;Manual techniques;Dry needling;Taping    PT Next Visit Plan assess response to DN 1 to left medial scapular muscles;  possibly add band scapular ex's and/or wall push ups to compliment DN; leg press and step downs    PT Home Exercise Plan [email or web address]           Patient will benefit from skilled therapeutic intervention in order to improve the following deficits and impairments:  Impaired UE functional use,Hypermobility,Pain,Impaired flexibility,Decreased mobility,Decreased strength,Postural dysfunction  Visit Diagnosis: Hypermobility syndrome  Pain in thoracic spine  Muscle weakness (generalized)  Muscle spasm of back     Problem List Patient Active Problem List   Diagnosis Date Noted  . Ehlers-Danlos syndrome 01/01/2019  . Attention deficit hyperactivity disorder (ADHD), predominantly inattentive type 01/03/2014   13/01/2014, PT 04/08/20 5:12 PM Phone: [phone] Fax: [phone] 04/08/2020, 5:12 PM  [facility] [address] [place], [place], [place] Phone: [phone]   Fax:  [phone]  Name: Kaitlyn Alvarez MRN: Judge Stall Date of Birth: 1991/01/31

## 2020-04-11 ENCOUNTER — Telehealth: Payer: Self-pay | Admitting: Physical Therapy

## 2020-04-11 ENCOUNTER — Ambulatory Visit: Payer: BC Managed Care – PPO | Admitting: Physical Therapy

## 2020-04-11 NOTE — Telephone Encounter (Signed)
PTA called pt for no show. Left message to call our office. Ane Payment, PTA @TODAY @ 9:19 AM

## 2020-04-16 ENCOUNTER — Ambulatory Visit: Payer: BC Managed Care – PPO | Admitting: Physical Therapy

## 2020-04-16 ENCOUNTER — Encounter: Payer: Self-pay | Admitting: Physical Therapy

## 2020-04-16 ENCOUNTER — Other Ambulatory Visit: Payer: Self-pay

## 2020-04-16 DIAGNOSIS — M357 Hypermobility syndrome: Secondary | ICD-10-CM

## 2020-04-16 DIAGNOSIS — M546 Pain in thoracic spine: Secondary | ICD-10-CM

## 2020-04-16 DIAGNOSIS — M6281 Muscle weakness (generalized): Secondary | ICD-10-CM

## 2020-04-16 DIAGNOSIS — M62830 Muscle spasm of back: Secondary | ICD-10-CM

## 2020-04-16 NOTE — Therapy (Signed)
[facility] [address] [place], [place], [address] Phone: [phone]   Fax:  [phone]  Physical Therapy Treatment  Patient Details  Name: CHRISTEL BAI MRN: [id number] Date of Birth: 12-02-1990 Referring Provider (PT): Shirlean Mylar MD   Encounter Date: 04/16/2020   PT End of Session - 04/16/20 0847    Visit Number 6    Date for PT Re-Evaluation 04/29/20    Authorization Type BCBS    PT Start Time 0843    PT Stop Time 0924    PT Time Calculation (min) 41 min    Activity Tolerance Patient tolerated treatment well    Behavior During Therapy [facility] for tasks assessed/performed           Past Medical History:  Diagnosis Date  . Anxiety   . Migraines     Past Surgical History:  Procedure Laterality Date  . TONSILLECTOMY      There were no vitals filed for this visit.                      [facility] Adult PT Treatment/Exercise - 04/16/20 0001      Lumbar Exercises: Supine   Ab Set --   5x warm up   Clam --   10x good return demo, added red band to advance for HEP   Bent Knee Raise --   added red band 10x, will add band to HEP   Dead Bug --   10x, red band for LE   Bridge --   10x     Knee/Hip Exercises: Aerobic   Nustep L3 x 6 min end of session. PTA present to monitor any pain increases. Advised pt this would be good option at th egym when she decides to return.      Knee/Hip Exercises: Machines for Strengthening   Cybex Leg Press Seat 7 Bil LE 70# 2x15, focus is on not hyperextending the knees      Knee/Hip Exercises: Standing   Heel Raises --   On mini tramp 10x   Rebounder weight shifting 3 ways 1 min VC to keep "secret bend in knee."                    PT Short Term Goals - 04/04/20 0910      PT SHORT TERM GOAL #1   Title Ind with initial HEP    Time 43    Period Weeks    Status Achieved    Target Date 03/25/20             PT Long Term Goals - 03/04/20  1259      PT LONG TERM GOAL #1   Title Ind with advanced HEP for stabilization and strengthening of joints.    Time 8    Period Weeks    Status New    Target Date 04/29/20      PT LONG TERM GOAL #2   Title Patient to report decreased instability in left knee with ADLS by 50% or more.    Time 8    Period Weeks    Status New      PT LONG TERM GOAL #3   Title Patient able to perform ADLS with at least 75% less pain in the back.    Time 8    Period Weeks    Status New      PT LONG TERM GOAL #4   Title Patient to  demo improved wrist strength to >= 4+/5 with improved ability to lift and carry items.    Time 8    Period Weeks    Status New      PT LONG TERM GOAL #5   Title Patient to demo improved standing posture without hyperextending bil knees.    Time 8    Period Weeks    Status New                 Plan - 04/16/20 3220    Clinical Impression Statement Pt presents pain free (still healing from turning her ankle a few weeks ago). Pt reports work has been super busy so compliance with HEP has been low. Pt could demo her last given core HEP correctly and was progressed to add red band for home. Pt reports the dry needling was excellent, it pretty much abolished her scapular pain. Pt demonstrates good knee stabilization today, not hyperextending with the various exercises she performed.    Personal Factors and Comorbidities Comorbidity 3+    Comorbidities Ehlers-Danlos Syndrome, anxiety, ADHD, migraines, obesity    Examination-Activity Limitations Carry;Locomotion Level    Stability/Clinical Decision Making Evolving/Moderate complexity    Rehab Potential Good    PT Frequency 2x / week    PT Duration 8 weeks    PT Treatment/Interventions ADLs/Self Care Home Management;Aquatic Therapy;Cryotherapy;Electrical Stimulation;Iontophoresis 4mg /ml Dexamethasone;Moist Heat;Neuromuscular re-education;Therapeutic exercise;Therapeutic activities;Patient/family education;Manual  techniques;Dry needling;Taping    PT Next Visit Plan Possible DN, review red band added to core exercises, continue to work on knee stabilization. Goal review/MMT in next 1-2 sessions    PT Home Exercise Plan [email or web address]           Patient will benefit from skilled therapeutic intervention in order to improve the following deficits and impairments:  Impaired UE functional use,Hypermobility,Pain,Impaired flexibility,Decreased mobility,Decreased strength,Postural dysfunction  Visit Diagnosis: Hypermobility syndrome  Pain in thoracic spine  Muscle weakness (generalized)  Muscle spasm of back     Problem List Patient Active Problem List   Diagnosis Date Noted  . Ehlers-Danlos syndrome 01/01/2019  . Attention deficit hyperactivity disorder (ADHD), predominantly inattentive type 01/03/2014    Witt Plitt, PTA 04/16/2020, 9:25 AM  [facility] [address] [place], [place], [place] Phone: [phone]   Fax:  [phone]  Name: JAYDA WHITE MRN: Judge Stall Date of Birth: 1990-07-08

## 2020-04-18 ENCOUNTER — Encounter: Payer: Self-pay | Admitting: Physical Therapy

## 2020-04-18 ENCOUNTER — Ambulatory Visit: Payer: BC Managed Care – PPO | Admitting: Physical Therapy

## 2020-04-18 ENCOUNTER — Other Ambulatory Visit: Payer: Self-pay

## 2020-04-18 DIAGNOSIS — M6281 Muscle weakness (generalized): Secondary | ICD-10-CM

## 2020-04-18 DIAGNOSIS — M357 Hypermobility syndrome: Secondary | ICD-10-CM

## 2020-04-18 DIAGNOSIS — M62830 Muscle spasm of back: Secondary | ICD-10-CM

## 2020-04-18 DIAGNOSIS — M546 Pain in thoracic spine: Secondary | ICD-10-CM

## 2020-04-18 NOTE — Therapy (Signed)
[facility] [address], [place], [place], [address] Phone: [phone]   Fax:  [phone]  Physical Therapy Treatment  Patient Details  Name: Kaitlyn Alvarez MRN: [id number] Date of Birth: May 21, 1990 Referring Provider (PT): Maurice Small MD   Encounter Date: 04/18/2020   PT End of Session - 04/18/20 0844    Visit Number 7    Date for PT Re-Evaluation 04/29/20    Authorization Type BCBS    PT Start Time 0844    PT Stop Time 0926    PT Time Calculation (min) 42 min    Activity Tolerance Patient tolerated treatment well    Behavior During Therapy [facility] for tasks assessed/performed           Past Medical History:  Diagnosis Date  . Anxiety   . Migraines     Past Surgical History:  Procedure Laterality Date  . TONSILLECTOMY      There were no vitals filed for this visit.   Subjective Assessment - 04/18/20 0846    Subjective Did fine after last session. No pain this AM, just tired from work.    Pertinent History Ehlers-Danlos Syndrome, anxiety, ADHD, migraines    Currently in Pain? No/denies    Multiple Pain Sites No                             [facility] Adult PT Treatment/Exercise - 04/18/20 0001      Lumbar Exercises: Seated   Other Seated Lumbar Exercises Attempted red band lift to facilitate multifidus contraction but this did aggrevate her wrists a little. A longer band may have been more helpful.      Lumbar Exercises: Supine   Clam --   2x15 with yellow loop   Bent Knee Raise --   2x15 with yelloe loop   Dead Bug --   holding 2# WTS 10x, took yellow band off, getting sore in hip joint   Bridge 10 reps;3 seconds      Knee/Hip Exercises: Aerobic   Nustep L3 x 8 min for warm up/tissue prep discussion of current status      Knee/Hip Exercises: Machines for Strengthening   Cybex Leg Press Seat 7 Bil LE 70# 2x15, focus is on not hyperextending the knees   Single leg 25# 15x each      Knee/Hip Exercises: Standing   Functional Squat --   10# KB to stool/mini squat/lift 10x   Rebounder weight shifting 3 ways 1 min VC to keep "secret bend in knee."    Walking with Sports Cord 20# pulling handles to abdomin reverse walking 10x                    PT Short Term Goals - 04/18/20 0850      PT SHORT TERM GOAL #2   Title decreased back pain by >= 30% with ADLS    Time 3    Period Weeks    Status Achieved   50% improved            PT Long Term Goals - 03/04/20 1259      PT LONG TERM GOAL #1   Title Ind with advanced HEP for stabilization and strengthening of joints.    Time 8    Period Weeks    Status New    Target Date 04/29/20      PT LONG TERM GOAL #2   Title  Patient to report decreased instability in left knee with ADLS by 23% or more.    Time 8    Period Weeks    Status New      PT LONG TERM GOAL #3   Title Patient able to perform ADLS with at least 76% less pain in the back.    Time 8    Period Weeks    Status New      PT LONG TERM GOAL #4   Title Patient to demo improved wrist strength to >= 4+/5 with improved ability to lift and carry items.    Time 8    Period Weeks    Status New      PT LONG TERM GOAL #5   Title Patient to demo improved standing posture without hyperextending bil knees.    Time 8    Period Weeks    Status New                 Plan - 04/18/20 0848    Clinical Impression Statement Pt reports she feels she is learning better how far to take her movements as prior to coming to PT she felt unsure of how she should perform her exercises. All short term goals are now met as pt reports her back pain is at least 50% improved since initial eval. pt demonstrated significant improvement in NOT hyperextending her knees during exercise. Some mild knee soreness with mini squat exercise today.    Personal Factors and Comorbidities Comorbidity 3+    Comorbidities Ehlers-Danlos Syndrome, anxiety, ADHD, migraines, obesity     Examination-Activity Limitations Carry;Locomotion Level    Stability/Clinical Decision Making Evolving/Moderate complexity    Rehab Potential Good    PT Frequency 2x / week    PT Duration 8 weeks    PT Treatment/Interventions ADLs/Self Care Home Management;Aquatic Therapy;Cryotherapy;Electrical Stimulation;Iontophoresis 16m/ml Dexamethasone;Moist Heat;Neuromuscular re-education;Therapeutic exercise;Therapeutic activities;Patient/family education;Manual techniques;Dry needling;Taping    PT Next Visit Plan Review HEP, see how pt feels things are going on her own, Continue with program design for stabilization exercises that do not aggrevate her joints.    PT Home Exercise Plan [email or web address]    Consulted and Agree with Plan of Care Patient           Patient will benefit from skilled therapeutic intervention in order to improve the following deficits and impairments:  Impaired UE functional use,Hypermobility,Pain,Impaired flexibility,Decreased mobility,Decreased strength,Postural dysfunction  Visit Diagnosis: Hypermobility syndrome  Pain in thoracic spine  Muscle weakness (generalized)  Muscle spasm of back     Problem List Patient Active Problem List   Diagnosis Date Noted  . Ehlers-Danlos syndrome 01/01/2019  . Attention deficit hyperactivity disorder (ADHD), predominantly inattentive type 01/03/2014    Aneshia Jacquet, PTA 04/18/2020, 9:29 AM  [facility] [address] [place] [address]Phone: [phone]  Fax:  [phone] Name: Kaitlyn VAILLANCOURTMRN: [id number] of Birth: 1November 07, 1992

## 2020-04-22 ENCOUNTER — Encounter: Payer: BC Managed Care – PPO | Admitting: Physical Therapy

## 2020-04-25 ENCOUNTER — Encounter: Payer: BC Managed Care – PPO | Admitting: Physical Therapy

## 2020-04-29 ENCOUNTER — Ambulatory Visit: Payer: BC Managed Care – PPO | Attending: Family Medicine | Admitting: Physical Therapy

## 2020-04-29 DIAGNOSIS — M546 Pain in thoracic spine: Secondary | ICD-10-CM | POA: Insufficient documentation

## 2020-04-29 DIAGNOSIS — M62830 Muscle spasm of back: Secondary | ICD-10-CM | POA: Insufficient documentation

## 2020-04-29 DIAGNOSIS — M6281 Muscle weakness (generalized): Secondary | ICD-10-CM | POA: Diagnosis present

## 2020-04-29 DIAGNOSIS — M357 Hypermobility syndrome: Secondary | ICD-10-CM

## 2020-04-29 NOTE — Therapy (Signed)
[facility] [address], [address] [place], [place], [address] Phone: [phone]   Fax:  [phone]  Physical Therapy Treatment/Recertification   Patient Details  Name: Kaitlyn Alvarez MRN: [id number] Date of Birth: 12-15-1990 Referring Provider (PT): Shirlean Mylar MD   Encounter Date: 04/29/2020   PT End of Session - 04/29/20 1710    Visit Number 8    Date for PT Re-Evaluation 06/24/20    Authorization Type BCBS    PT Start Time 1615    PT Stop Time 1700    PT Time Calculation (min) 45 min    Activity Tolerance Patient tolerated treatment well           Past Medical History:  Diagnosis Date  . Anxiety   . Migraines     Past Surgical History:  Procedure Laterality Date  . TONSILLECTOMY      There were no vitals filed for this visit.   Subjective Assessment - 04/29/20 1618    Subjective Has a new job at the [employer].  I do pretty good after PT.  I still have some burning with toe touches or with lying on my back and stretching.    Pertinent History Ehlers-Danlos Syndrome, anxiety, ADHD, migraines    Patient Stated Goals figure out whats happening with her knee; controlling hyperextension of joints, address back pain    Currently in Pain? No/denies    Pain Score 0-No pain              [facility] PT Assessment - 04/29/20 0001      Strength   Overall Strength Comments left ankle inv 5/5, ever 4+/5 some pain, DF 5/5, PF strong with manual resistance    Right Hand Grip (lbs) level 2 23#    Left Hand Grip (lbs) 23   level 2                        [facility] Adult PT Treatment/Exercise - 04/29/20 0001      Therapeutic Activites    Therapeutic Activities Lifting    Lifting hip hinge with golf club 10x; 2 4# wts partial dead lift 8x ; 10# partial dead lift 10x      Lumbar Exercises: Seated   Other Seated Lumbar Exercises seated neural floss 5x right/left      Lumbar Exercises: Supine   Other  Supine Lumbar Exercises neural glide ankle pump 8x right/left    Other Supine Lumbar Exercises neural glide at knee with pointed toe 8x right/left      Knee/Hip Exercises: Machines for Strengthening   Cybex Leg Press seat 7 85# 20x; 40# single leg 15x right/left                  PT Education - 04/29/20 1710    Education Details seated and supine neural glide/floss;  hip hinge with broom and dead lift with 10# weight at home    Person(s) Educated Patient    Methods Explanation;Demonstration;Handout    Comprehension Verbalized understanding;Returned demonstration            PT Short Term Goals - 04/29/20 1718      PT SHORT TERM GOAL #1   Title Ind with initial HEP    Status Achieved      PT SHORT TERM GOAL #2   Title decreased back pain by >= 30% with ADLS    Status Achieved  PT Long Term Goals - 04/29/20 1719      PT LONG TERM GOAL #1   Title Ind with advanced HEP for stabilization and strengthening of joints.    Time 8    Period Weeks    Status On-going    Target Date 06/24/20      PT LONG TERM GOAL #2   Title Patient to report decreased instability in left knee with ADLS by 50% or more.    Time 8    Period Weeks    Status On-going      PT LONG TERM GOAL #3   Title Patient able to perform ADLS with at least 75% less pain in the back.    Status Achieved      PT LONG TERM GOAL #4   Title Patient to demo improved wrist strength to >= 4+/5 with improved ability to lift and carry items.    Time 8    Period Weeks    Status On-going      PT LONG TERM GOAL #5   Title Patient to demo improved standing posture without hyperextending bil knees.    Status Achieved      Additional Long Term Goals   Additional Long Term Goals Yes      PT LONG TERM GOAL #6   Title The patient will demonstrate appropriate intensity level of neural gliding in order to reduce neural LE symptoms with ADLs by 25%    Time 8    Period Weeks    Status New      PT  LONG TERM GOAL #7   Title The patient will be able to lift 20# needed to unpack moving boxes    Time 8    Period Weeks    Status New                 Plan - 04/29/20 1621    Clinical Impression Statement The patient rates her overall improvement in back pain at 70-75%.  She reports less frequent patellar instability overall and fewer cues needed to avoid knee hyperextension.   She is able to ascend and descend steps reciprocally.  Her grip strength has not improved since last assessment with patient reports variability in pain and strength in her wrists.  She requests work on neural mobilization to relieve bil LE symptoms with bending over to touch her toes and long sitting (max neural tension).  Initiated single component gliding in supine and seated flossing with reproduction of symptoms but low in intensity.  Also initiated dead lifting for trunk strengthening as patient reports she has not unpacked her moving backs b/c of back pain.  Recommend continued PT at a decreased frequency of 1x/week.    Comorbidities Ehlers-Danlos Syndrome, anxiety, ADHD, migraines, obesity    Rehab Potential Good    PT Frequency 1x / week    PT Duration 8 weeks    PT Treatment/Interventions ADLs/Self Care Home Management;Aquatic Therapy;Cryotherapy;Electrical Stimulation;Iontophoresis 4mg /ml Dexamethasone;Moist Heat;Neuromuscular re-education;Therapeutic exercise;Therapeutic activities;Patient/family education;Manual techniques;Dry needling;Taping    PT Next Visit Plan dead lifts 10# with review of hip hinge;  assess response to supine and seated neural glides; trunk strengthening; LE strengthening    PT Home Exercise Plan [email or web address]           Patient will benefit from skilled therapeutic intervention in order to improve the following deficits and impairments:  Impaired UE functional use,Hypermobility,Pain,Impaired flexibility,Decreased mobility,Decreased strength,Postural dysfunction  Visit  Diagnosis: Hypermobility syndrome - Plan: PT plan of care cert/re-cert  Pain in thoracic spine - Plan: PT plan of care cert/re-cert  Muscle weakness (generalized) - Plan: PT plan of care cert/re-cert  Muscle spasm of back - Plan: PT plan of care cert/re-cert     Problem List Patient Active Problem List   Diagnosis Date Noted  . Ehlers-Danlos syndrome 01/01/2019  . Attention deficit hyperactivity disorder (ADHD), predominantly inattentive type 01/03/2014   Lavinia Sharps, PT 04/29/20 5:23 PM Phone: [phone] Fax: [phone] Vivien Presto 04/29/2020, 5:23 PM  [facility] [address], [address] [place], [place], [address] Phone: [phone]   Fax:  [phone]  Name: Kaitlyn Alvarez MRN: [id number] Date of Birth: [birth year]

## 2020-04-29 NOTE — Patient Instructions (Signed)
Access Code: [email or web address] URL: [email or web address] Date: 04/29/2020 Prepared by: Lavinia Sharps  Exercises Thoracic Extension Mobilization on Foam Roll - 2 x daily - 7 x weekly - 2 sets - 5 reps Seated Scapular Retraction - 1 x daily - 7 x weekly - 1-3 sets - 10 reps - 2-3 sec hold Supine ITB Stretch with Strap - 2 x daily - 7 x weekly - 1 sets - 3 reps - 60 sec hold Supine Hamstring Stretch with Strap - 2 x daily - 7 x weekly - 1 sets - 3 reps - 60 sec hold Seated Figure 4 Piriformis Stretch - 2 x daily - 7 x weekly - 3 reps - 1 sets - 30-60 sec hold Isometric Wrist Extension Pronated - 1 x daily - 7 x weekly - 1 sets - 5 reps - 10 sec hold Seated Isometric Wrist Flexion Supinated with Manual Resistance - 1 x daily - 7 x weekly - 1 sets - 5 reps - 10 sec hold Seated Isometric Wrist Radial Deviation with Manual Resistance - 1 x daily - 7 x weekly - 1 sets - 5 reps - 10 sec hold Seated Isometric Wrist Ulnar Deviation with Manual Resistance - 1 x daily - 7 x weekly - 1 sets - 5 reps - 10 sec hold Supine Transversus Abdominis Bracing with Double Leg Fallout - 1 x daily - 7 x weekly - 2 sets - 10 reps Supine March - 1 x daily - 7 x weekly - 2 sets - 10 reps Supine Dead Bug with Leg Extension - 1 x daily - 7 x weekly - 1 sets - 10 reps Supine Bridge - 1 x daily - 7 x weekly - 2 sets - 10 reps - 3 hold Supine Sciatic Nerve Glide - 1 x daily - 7 x weekly - 2 sets - 5 reps Seated Slump Nerve Glide - 1 x daily - 7 x weekly - 1 sets - 8 reps Standing Hip Hinge with Dowel - 1 x daily - 7 x weekly - 1 sets - 10 reps Half Dead Lift with Kettlebell - 1 x daily - 7 x weekly - 1 sets - 10 reps

## 2020-05-02 ENCOUNTER — Encounter: Payer: BC Managed Care – PPO | Admitting: Physical Therapy

## 2020-05-06 ENCOUNTER — Other Ambulatory Visit: Payer: Self-pay

## 2020-05-06 ENCOUNTER — Ambulatory Visit: Payer: BC Managed Care – PPO | Admitting: Physical Therapy

## 2020-05-06 DIAGNOSIS — M6281 Muscle weakness (generalized): Secondary | ICD-10-CM

## 2020-05-06 DIAGNOSIS — M546 Pain in thoracic spine: Secondary | ICD-10-CM

## 2020-05-06 DIAGNOSIS — M357 Hypermobility syndrome: Secondary | ICD-10-CM | POA: Diagnosis not present

## 2020-05-06 DIAGNOSIS — M62830 Muscle spasm of back: Secondary | ICD-10-CM

## 2020-05-06 NOTE — Therapy (Signed)
[facility] [address] [place], [place], [address] Phone: [phone]   Fax:  [phone]  Physical Therapy Treatment/Discharge summary   Patient Details  Name: Kaitlyn Alvarez MRN: [id number] Date of Birth: 03-04-1990 Referring Provider (PT): Maurice Small MD   Encounter Date: 05/06/2020   PT End of Session - 05/06/20 1700    Visit Number 9    Date for PT Re-Evaluation 06/24/20    Authorization Type BCBS    PT Start Time 5993    PT Stop Time 5701   discharge visit   PT Time Calculation (min) 30 min    Activity Tolerance Patient tolerated treatment well           Past Medical History:  Diagnosis Date  . Anxiety   . Migraines     Past Surgical History:  Procedure Laterality Date  . TONSILLECTOMY      There were no vitals filed for this visit.   Subjective Assessment - 05/06/20 1623    Subjective With new job unable to continue PT.    Pertinent History Ehlers-Danlos Syndrome, anxiety, ADHD, migraines    How long can you stand comfortably? 45 min              [facility] PT Assessment - 05/06/20 0001      Strength   Overall Strength Comments left ankle inv 5/5, ever 4+/5 some pain, DF 5/5, PF strong with manual resistance    Right Hand Grip (lbs) level 2 23#    Left Hand Grip (lbs) 23   level 2                        [facility] Adult PT Treatment/Exercise - 05/06/20 0001      Therapeutic Activites    Therapeutic Activities Lifting    Lifting hip hinge with golf club 10x; 2 4# wts partial dead lift 8x ; 10# partial dead lift 10x      Lumbar Exercises: Seated   Other Seated Lumbar Exercises seated neural floss 5x right/left    Other Seated Lumbar Exercises review of HEP including wrist isometrics; discussed appropriate gym equipment; progression of HEP over the next few months                    PT Short Term Goals - 05/06/20 1706      PT SHORT TERM GOAL #1   Title Ind with  initial HEP    Status Achieved      PT SHORT TERM GOAL #2   Title decreased back pain by >= 30% with ADLS    Status Achieved             PT Long Term Goals - 05/06/20 1621      PT LONG TERM GOAL #1   Title Ind with advanced HEP for stabilization and strengthening of joints.    Status Achieved      PT LONG TERM GOAL #2   Title Patient to report decreased instability in left knee with ADLS by 77% or more.    Baseline "fairly good"    Status Achieved      PT LONG TERM GOAL #3   Title Patient able to perform ADLS with at least 93% less pain in the back.    Status Achieved      PT LONG TERM GOAL #4   Title Patient to demo improved wrist strength to >= 4+/5 with improved ability  to lift and carry items.    Status Achieved      PT LONG TERM GOAL #5   Title Patient to demo improved standing posture without hyperextending bil knees.    Status Achieved      PT LONG TERM GOAL #6   Title The patient will demonstrate appropriate intensity level of neural gliding in order to reduce neural LE symptoms with ADLs by 25%    Status Achieved      PT LONG TERM GOAL #7   Title The patient will be able to lift 20# needed to unpack moving boxes    Status Not Met                 Plan - 05/06/20 1625    Clinical Impression Statement The patient is starting a new job next week and has decided that it would be best to discontinue PT with the change in insurance.     She rates her overall improvement in back pain at 70-75% and decreased frequency of knee instability.   We have reviewed her HEP and discussed areas of focus including neural gliding and strengthening of core and LEs.  She has met the majority of rehab goals except for last goal for lifting 20#.    PT Tome           Patient will benefit from skilled therapeutic intervention in order to improve the following deficits and impairments:     Visit Diagnosis: Hypermobility syndrome  Pain in thoracic  spine  Muscle weakness (generalized)  Muscle spasm of back  PHYSICAL THERAPY DISCHARGE SUMMARY  Visits from Start of Care: 9  Current functional level related to goals / functional outcomes: See clinical summary above   Remaining deficits: As above   Education / Equipment: HEP Plan: Patient agrees to discharge.  Patient goals were partially met. Patient is being discharged due to the patient's request.  ?????          Problem List Patient Active Problem List   Diagnosis Date Noted  . Ehlers-Danlos syndrome 01/01/2019  . Attention deficit hyperactivity disorder (ADHD), predominantly inattentive type 01/03/2014   Ruben Im, PT 05/06/20 5:08 PM Phone: [phone] Fax: [phone] Alvera Singh 05/06/2020, 5:07 PM  [facility] [address], [place], [place], [address] Phone: [phone]   Fax:  [phone]  Name: Kaitlyn Alvarez MRN: [id number] Date of Birth: [birth year]

## 2020-05-09 ENCOUNTER — Encounter: Payer: BC Managed Care – PPO | Admitting: Physical Therapy

## 2020-05-15 ENCOUNTER — Ambulatory Visit: Payer: BC Managed Care – PPO | Admitting: Physical Therapy

## 2020-05-22 ENCOUNTER — Encounter: Payer: BC Managed Care – PPO | Admitting: Physical Therapy

## 2020-05-27 ENCOUNTER — Encounter: Payer: BC Managed Care – PPO | Admitting: Physical Therapy

## 2020-06-17 ENCOUNTER — Encounter: Payer: BC Managed Care – PPO | Admitting: Physical Therapy

## 2020-06-24 ENCOUNTER — Encounter: Payer: BC Managed Care – PPO | Admitting: Physical Therapy

## 2020-08-07 ENCOUNTER — Other Ambulatory Visit: Payer: Self-pay | Admitting: Family Medicine

## 2020-08-07 DIAGNOSIS — E282 Polycystic ovarian syndrome: Secondary | ICD-10-CM

## 2020-08-22 ENCOUNTER — Other Ambulatory Visit: Payer: BC Managed Care – PPO

## 2020-09-02 ENCOUNTER — Ambulatory Visit
Admission: RE | Admit: 2020-09-02 | Discharge: 2020-09-02 | Disposition: A | Discharge status: Home / Self Care | Payer: Managed Care, Other (non HMO) | Source: Ambulatory Visit | Attending: Family Medicine | Admitting: Family Medicine

## 2020-09-02 DIAGNOSIS — E282 Polycystic ovarian syndrome: Secondary | ICD-10-CM

## 2021-03-30 ENCOUNTER — Other Ambulatory Visit (HOSPITAL_BASED_OUTPATIENT_CLINIC_OR_DEPARTMENT_OTHER): Payer: Self-pay

## 2021-03-30 MED ORDER — OZEMPIC (0.25 OR 0.5 MG/DOSE) 2 MG/1.5ML ~~LOC~~ SOPN
PEN_INJECTOR | SUBCUTANEOUS | 1 refills | Status: DC
  Filled 2021-03-30: qty 1.5, 30d supply, fill #0

## 2021-04-06 ENCOUNTER — Other Ambulatory Visit (HOSPITAL_BASED_OUTPATIENT_CLINIC_OR_DEPARTMENT_OTHER): Payer: Self-pay

## 2021-04-29 ENCOUNTER — Encounter: Payer: Self-pay | Admitting: Neurology

## 2021-04-29 ENCOUNTER — Ambulatory Visit: Payer: Managed Care, Other (non HMO) | Admitting: Neurology

## 2021-04-29 VITALS — BP 142/93 | HR 103 | Ht 65.0 in | Wt 242.0 lb

## 2021-04-29 DIAGNOSIS — Z9189 Other specified personal risk factors, not elsewhere classified: Secondary | ICD-10-CM

## 2021-04-29 DIAGNOSIS — R0683 Snoring: Secondary | ICD-10-CM

## 2021-04-29 DIAGNOSIS — G4719 Other hypersomnia: Secondary | ICD-10-CM

## 2021-04-29 DIAGNOSIS — G4710 Hypersomnia, unspecified: Secondary | ICD-10-CM

## 2021-04-29 DIAGNOSIS — Z820 Family history of epilepsy and other diseases of the nervous system: Secondary | ICD-10-CM

## 2021-04-29 DIAGNOSIS — R519 Headache, unspecified: Secondary | ICD-10-CM

## 2021-04-29 DIAGNOSIS — R635 Abnormal weight gain: Secondary | ICD-10-CM

## 2021-04-29 NOTE — Progress Notes (Signed)
subjective:    Patient ID: PATI THINNES is a 31 y.o. female.  HPI    Huston Foley, MD, PhD [facility] [address] [address] [place], [place] [address]  Dear Drs. Koirala and [place],   I saw your patient, Lailoni Baquera, upon your request of the right hip clinic today for initial consultation of her sleep disorder, in particular, concern for underlying obstructive sleep apnea.  The patient is unaccompanied today.  As you know, Ms. Towell is a 31 year old right-handed woman with an underlying medical history of migraines, ADD, vitamin D deficiency, depression, anxiety, and severe obesity with a BMI of over 40, who reports an approximately 2-year history of excessive daytime somnolence.  She has had significant weight gain in the recent past and reports that she gained about 100 pounds within 2 months.  She reports some snoring, her father has sleep apnea and has a CPAP machine.  She reports that she had COVID a couple of years ago and feels that her fatigue increased after that and she has not had any improvement in her fatigue.  She had recent medication changes, she is no longer on Effexor or Prozac, she started sertraline within the past 3 months, currently at 100 mg daily.  She continues to take temazepam 30 mg at bedtime per psychiatry.  She also takes Vyvanse daily for ADHD.  Her Epworth sleepiness score is 19 out of 24, fatigue severity score is 54 out of 63.  She occasionally wakes up with a headache.  She denies night denied nocturia, she has occasional vivid dreams, denies any telltale symptoms of cataplexy, sleep paralysis, hypnagogic or hypnopompic hallucinations.  Bedtime is generally between 11 and midnight but it may not be until 1 or 2 AM that she falls asleep.  Rise time is between 7 and 7:30 AM.  She works as an Publishing copy.  She lives with her partner, 1 cat in the household, they have a TV in the bedroom which is occasionally on  at night.  She drinks caffeine in the form of diet sodas, 1 or 2 cans/day, no alcohol currently, she is a non-smoker.  She does not use any illicit drugs.  I reviewed your office records, she saw Dr. Clyde Canterbury on 02/03/2021.  She previously followed with Dr. Shirlean Mylar. She has not fallen asleep at the wheel but has come close to dozing off while driving.  She is strongly advised to avoid longer distance and highway driving and generally speaking not to drive when feeling sleepy.  Her Past Medical History Is Significant For: Past Medical History:  Diagnosis Date   Anxiety    Migraines     Her Past Surgical History Is Significant For: Past Surgical History:  Procedure Laterality Date   TONSILLECTOMY      Her Family History Is Significant For: Family History  Problem Relation Age of Onset   Sleep disorder Mother    Obstructive Sleep Apnea Father    ADD / ADHD Brother     Her Social History Is Significant For: Social History   Socioeconomic History   Marital status: Single    Spouse name: Not on file   Number of children: Not on file   Years of education: Not on file   Highest education level: Not on file  Occupational History   Not on file  Tobacco Use   Smoking status: Never   Smokeless tobacco: Never  Substance and Sexual Activity   Alcohol use:  Yes    Alcohol/week: 2.0 standard drinks    Types: 2 Shots of liquor per week    Comment: occasionally- 2 drinks a week   Drug use: No   Sexual activity: Yes    Birth control/protection: None  Other Topics Concern   Not on file  Social History Narrative   Not on file   Social Determinants of Health   Financial Resource Strain: Not on file  Food Insecurity: Not on file  Transportation Needs: Not on file  Physical Activity: Not on file  Stress: Not on file  Social Connections: Not on file    Her Allergies Are:  No Known Allergies:   Her Current Medications Are:  Outpatient Encounter Medications as of 04/29/2021   Medication Sig   OnabotulinumtoxinA (BOTOX IM) Inject into the muscle. For migraines   ondansetron (ZOFRAN) 4 MG tablet Take 4 mg by mouth as needed for nausea or vomiting.   rizatriptan (MAXALT) 10 MG tablet Take 10 mg by mouth as needed for migraine. May repeat in 2 hours if needed   Semaglutide,0.25 or 0.5MG /DOS, (OZEMPIC, 0.25 OR 0.5 MG/DOSE,) 2 MG/1.5ML SOPN Inject 0.25 mg Subcutaneous Once a week 30 days   TEMAZEPAM PO Take 10 mg by mouth daily.   VYVANSE 60 MG capsule Take 70 mg by mouth daily.    sertraline (ZOLOFT) 100 MG tablet Take 100 mg by mouth daily.   [DISCONTINUED] FLUoxetine (PROZAC) 40 MG capsule Take 40 mg by mouth daily.   No facility-administered encounter medications on file as of 04/29/2021.  :   Review of Systems:  Out of a complete 14 point review of systems, all are reviewed and negative with the exception of these symptoms as listed below:   Review of Systems  Neurological:        Here for sleep consult. No prior sleep study. Pt reports she struggles with daytime fatigue. Snoring is present. Reports sx started soon after dx with COVID in feb of 2021.     Objective:  Neurological Exam  Physical Exam Physical Examination:   Vitals:   04/29/21 0953  BP: (!) 142/93  Pulse: (!) 103    General Examination: The patient is a very pleasant 31 y.o. female in no acute distress. She appears well-developed and well-nourished and well groomed.   HEENT: Normocephalic, atraumatic, pupils are equal, round and reactive to light, extraocular tracking is good without limitation to gaze excursion or nystagmus noted. Hearing is grossly intact. Face is symmetric with normal facial animation. Speech is clear with no dysarthria noted. There is no hypophonia. There is no lip, neck/head, jaw or voice tremor. Neck is supple with full range of passive and active motion. There are no carotid bruits on auscultation. Oropharynx exam reveals: mild mouth dryness, good dental hygiene  and no significant airway crowding, tonsils absent, Mallampati class I, neck circumference 16-7/8 inches, minimal overbite.  Tongue protrudes centrally and palate elevates symmetrically.   Chest: Clear to auscultation without wheezing, rhonchi or crackles noted.  Heart: S1+S2+0, regular and normal without murmurs, rubs or gallops noted.   Abdomen: Soft, non-tender and non-distended with normal bowel sounds appreciated on auscultation.  Extremities: There is no pitting edema in the distal lower extremities bilaterally.   Skin: Warm and dry without trophic changes noted.   Musculoskeletal: exam reveals no obvious joint deformities, tenderness or joint swelling or erythema.   Neurologically:  Mental status: The patient is awake, alert and oriented in all 4 spheres. Her immediate and remote  memory, attention, language skills and fund of knowledge are appropriate. There is no evidence of aphasia, agnosia, apraxia or anomia. Speech is clear with normal prosody and enunciation. Thought process is linear. Mood is normal and affect is normal.  Cranial nerves II - XII are as described above under HEENT exam.  Motor exam: Normal bulk, strength and tone is noted. There is no tremor, Romberg is negative. Reflexes are 2+ throughout. Fine motor skills and coordination: grossly intact.  Cerebellar testing: No dysmetria or intention tremor. There is no truncal or gait ataxia.  Sensory exam: intact to light touch in the upper and lower extremities.  Gait, station and balance: She stands easily. No veering to one side is noted. No leaning to one side is noted. Posture is age-appropriate and stance is narrow based. Gait shows normal stride length and normal pace. No problems turning are noted.   Assessment and Plan:  In summary, SHAENA PARKERSON is a very pleasant 31 y.o.-year old female with an underlying medical history of migraines, ADD, vitamin D deficiency, depression, anxiety, and severe obesity with a BMI  of over 40, who presents for evaluation of her hypersomnolence.  We talked about the different reasons for hypersomnolence typically.  She is potentially sleep deprived as she does not typically get 7 to 8 hours of sleep a consistent basis.  She is advised that certain medications can increase sleepiness and she may be at risk for obstructive sleep apnea given her obesity and neck circumference as well as family history of sleep apnea.  Her history is not telltale for narcolepsy but an underlying hypersomnolence disorder as in narcolepsy without cataplexy or idiopathic hypersomnolence are not of the table.  We talked about the different ways of evaluating sleep disturbances.  She is advised that for narcolepsy evaluation and hypersomnolence evaluation we proceed with extended sleep testing including a nighttime sleep study followed by a daytime nap study.  In order to pursue this testing she would have to be off of psychotropic and centrally acting medications that could affect sleep and this would include in her case sertraline, temazepam, and Vyvanse.  She is advised to discuss this with her psychiatrist first.  We mutually agreed to proceed with a nocturnal polysomnogram to look for an underlying organic cause of her sleepiness such as sleep disordered breathing.  We will have to have her taper off her medication for a evaluation for sleep apnea.  She is agreeable to pursuing this at this moment.  We will call her to schedule her sleep study.  Of note, she has a mildly elevated heart rate and elevated blood pressure values today.  She is not symptomatic from this and reports that she always has tachycardia.  We talked about treatment options for sleep apnea including surgical options, CPAP or AutoPap therapy versus a dental device.  We will pick up our discussion after sleep testing.  I outlined the differences between a laboratory attended sleep study versus home sleep test.  We will keep her posted as to her  test results by phone call or MyChart message.  She is advised not to drive when feeling sleepy and currently to definitely avoid long distance and highway driving. I answered all her questions today and she was in agreement.   Thank you very much for allowing me to participate in the care of this nice patient. If I can be of any further assistance to you please do not hesitate to call me at [phone].  Sincerely,  Huston FoleySaima Shanti Eichel, MD, PhD

## 2021-04-29 NOTE — Patient Instructions (Addendum)
Thank you for choosing [facility] for your sleep related care! ?It was nice to meet you today!  ? ?Here is what we discussed today:  ?  ?Based on your symptoms and your exam I believe you are at risk for obstructive sleep apnea (aka OSA), and I think we should proceed with a sleep study to determine whether you do or do not have OSA and how severe it is. Even, if you have mild OSA, I may want you to consider treatment with CPAP, as treatment of even borderline or mild sleep apnea can result and improvement of symptoms such as sleep disruption, daytime sleepiness, nighttime bathroom breaks, restless leg symptoms, improvement of headache syndromes, even improved mood disorder.  ? ?As explained, an attended sleep study (meaning you get to stay overnight in the sleep lab), lets [place] monitor sleep-related behaviors such as sleep talking and leg movements in sleep, in addition to monitoring for sleep apnea.  A home sleep test is a screening tool for sleep apnea diagnosis only, but unfortunately, does not help with any other sleep-related diagnoses. ? ?Please remember, the long-term risks and ramifications of untreated moderate to severe obstructive sleep apnea may include (but are not limited to): increased risk for cardiovascular disease, including congestive heart failure, stroke, difficult to control hypertension, treatment resistant obesity, arrhythmias, especially irregular heartbeat commonly known as A. Fib. (atrial fibrillation); even type 2 diabetes has been linked to untreated OSA.  ?Other correlations that untreated obstructive sleep apnea include macular edema which is swelling of the retina in the eyes, droopy eyelid syndrome, and elevated hemoglobin and hematocrit levels (often referred to as polycythemia). ? ?Sleep apnea can cause disruption of sleep and sleep deprivation in most cases, which, in turn, can cause recurrent headaches, problems with memory, mood, concentration, focus, and  vigilance. Most people with untreated sleep apnea report excessive daytime sleepiness, which can affect their ability to drive. Please do not drive if you feel sleepy. Patients with sleep apnea can also develop difficulty initiating and maintaining sleep (aka insomnia).  ? ?Having sleep apnea may increase your risk for other sleep disorders, including involuntary behaviors sleep such as sleep terrors, sleep talking, sleepwalking.   ? ?Having sleep apnea can also increase your risk for restless leg syndrome and leg movements at night.  ? ?Please note that untreated obstructive sleep apnea may carry additional perioperative morbidity. Patients with significant obstructive sleep apnea (typically, in the moderate to severe degree) should receive, if possible, perioperative PAP (positive airway pressure) therapy and the surgeons and particularly the anesthesiologists should be informed of the diagnosis and the severity of the sleep disordered breathing.  ? ? ?As discussed, in order to proceed with extended sleep testing, for evaluation for narcolepsy, you would have to taper off the Vyvanse, Temazepam and sertraline and stay off these meds for about 2-3 weeks prior to testing.  ? ?Our sleep lab administrative assistant will call you to schedule your sleep study and give you further instructions, regarding the check in process for the sleep study, arrival time, what to bring, when you can expect to leave after the study, etc., and to answer any other logistical questions you may have. If you don't hear back from her by about 2 weeks from now, please feel free to call her direct line at [phone] or you can call our general clinic number, or email [place] through My Chart.  ? ?

## 2021-05-11 ENCOUNTER — Other Ambulatory Visit (HOSPITAL_BASED_OUTPATIENT_CLINIC_OR_DEPARTMENT_OTHER): Payer: Self-pay

## 2021-05-11 MED ORDER — AMPHETAMINE-DEXTROAMPHETAMINE 10 MG PO TABS
ORAL_TABLET | ORAL | 0 refills | Status: DC
  Filled 2021-05-11: qty 30, 30d supply, fill #0
  Filled 2021-05-11: qty 90, 90d supply, fill #0

## 2021-05-18 ENCOUNTER — Other Ambulatory Visit (HOSPITAL_BASED_OUTPATIENT_CLINIC_OR_DEPARTMENT_OTHER): Payer: Self-pay

## 2021-05-18 MED ORDER — VYVANSE 70 MG PO CAPS
ORAL_CAPSULE | ORAL | 0 refills | Status: DC
  Filled 2021-05-18 – 2021-05-19 (×2): qty 34, 34d supply, fill #0
  Filled 2021-05-19: qty 30, 30d supply, fill #0

## 2021-05-19 ENCOUNTER — Other Ambulatory Visit (HOSPITAL_BASED_OUTPATIENT_CLINIC_OR_DEPARTMENT_OTHER): Payer: Self-pay

## 2021-05-20 ENCOUNTER — Other Ambulatory Visit (HOSPITAL_BASED_OUTPATIENT_CLINIC_OR_DEPARTMENT_OTHER): Payer: Self-pay

## 2021-05-20 ENCOUNTER — Ambulatory Visit (INDEPENDENT_AMBULATORY_CARE_PROVIDER_SITE_OTHER): Payer: Managed Care, Other (non HMO) | Admitting: Neurology

## 2021-05-20 DIAGNOSIS — G4719 Other hypersomnia: Secondary | ICD-10-CM

## 2021-05-20 DIAGNOSIS — R519 Headache, unspecified: Secondary | ICD-10-CM

## 2021-05-20 DIAGNOSIS — R0683 Snoring: Secondary | ICD-10-CM

## 2021-05-20 DIAGNOSIS — Z820 Family history of epilepsy and other diseases of the nervous system: Secondary | ICD-10-CM

## 2021-05-20 DIAGNOSIS — R635 Abnormal weight gain: Secondary | ICD-10-CM

## 2021-05-20 DIAGNOSIS — G4710 Hypersomnia, unspecified: Secondary | ICD-10-CM | POA: Diagnosis not present

## 2021-05-20 DIAGNOSIS — Z9189 Other specified personal risk factors, not elsewhere classified: Secondary | ICD-10-CM

## 2021-05-25 NOTE — Progress Notes (Signed)
See procedure note.

## 2021-05-26 ENCOUNTER — Telehealth: Payer: Self-pay | Admitting: *Deleted

## 2021-05-26 NOTE — Telephone Encounter (Signed)
LVM for patient to call me back for her sleep study results  ? ?            ?

## 2021-05-26 NOTE — Telephone Encounter (Signed)
-----   Message from Star Age, MD sent at 05/26/2021  1:01 PM EDT ----- ?Please advise patient that her home sleep test from 05/20/2021 did not show any significant sleep apnea.  She had mild intermittent snoring.  We had talked about extended sleep testing to evaluate her for an underlying sleepiness condition, for this, she would have to taper off her antidepressant and anti-anxiety medications as well as ADHD medication.  If she wishes to pursue this, she needs to talk to her prescribing physician/provider about coming off of these medications with a taper gradually. We can certainly pursue additional sleep testing when she is ready.  For now, she can follow-up with me as needed. ?

## 2021-05-26 NOTE — Procedures (Signed)
? ?  [facility] ? ?HOME SLEEP TEST (Watch PAT) REPORT ? ?STUDY DATE: 05/20/21 ? ?DOB: 06/29/1990 ? ?MRN: [id number] ? ?ORDERING CLINICIAN: Huston Foley, MD, PhD ?  ?REFERRING CLINICIAN: Dr. Docia Chuck ? ?CLINICAL INFORMATION/HISTORY: 32 year old right-handed woman with an underlying medical history of migraines, ADD, vitamin D deficiency, depression, anxiety, and severe obesity with a BMI of over 40, who reports an approximately 2-year history of excessive daytime somnolence.  She has had significant weight gain in the recent past and reports that she gained about 100 pounds within 2 months.  She reports some snoring. ? ?Epworth sleepiness score: 19/24. ? ?BMI: 40.4 kg/m? ? ?FINDINGS:  ? ?Sleep Summary:  ? ?Total Recording Time (hours, min): 7 hours, 48 minutes ? ?Total Sleep Time (hours, min):  6 hours, 46 minutes  ? ?Percent REM (%):    21.4%  ? ?Respiratory Indices:  ? ?Calculated pAHI (per hour):  1.4/hour        ? ?REM pAHI:    1.7/hour      ? ?NREM pAHI: 1.4/hour ? ?Oxygen Saturation Statistics:  ?  ?Oxygen Saturation (%) Mean: 95%  ? ?Minimum oxygen saturation (%):                 87%  ? ?O2 Saturation Range (%): 87-98%   ? ?O2 Saturation (minutes) <=88%: 0 min ? ?Pulse Rate Statistics:  ? ?Pulse Mean (bpm):    85/min   ? ?Pulse Range (69-117/min)  ? ?IMPRESSION: Primary snoring ? ?RECOMMENDATION:  ?This home sleep test does not demonstrate any significant obstructive or central sleep disordered breathing. Some snoring was noted and appeared to be in the mild range, intermittent. Other causes of the patient's symptoms, including circadian rhythm disturbances, an underlying mood disorder, medication effect and/or an underlying medical problem cannot be ruled out based on this test. Clinical correlation is recommended.  If clinical suspicion for an underlying hypersomnolence disorder persists, a laboratory attended nocturnal polysomnogram with a next-day MSLT can be considered.  The patient should  be cautioned not to drive, work at heights, or operate dangerous or heavy equipment when tired or sleepy. Review and reiteration of good sleep hygiene measures should be pursued with any patient. ?The patient can follow up with her referring provider, who will be notified of the test results. An appointment in sleep clinic can be made as necessary.  ? ?I certify that I have reviewed the raw data recording prior to the issuance of this report in accordance with the standards of the American Academy of Sleep Medicine (AASM). ? ?INTERPRETING PHYSICIAN:  ? ?Huston Foley, MD, PhD  ?Board Certified in Neurology and Sleep Medicine ? ?[facility] ?[address], [address] ?[place], [place] [address] ?[phone] ? ? ? ? ? ? ? ? ? ? ? ? ? ? ? ?

## 2021-05-27 ENCOUNTER — Telehealth: Payer: Self-pay | Admitting: *Deleted

## 2021-05-27 NOTE — Telephone Encounter (Signed)
I reviewed her chart again, all psychotropic medications need to be tapered off and she should be off for 2 to 3 weeks completely prior to sleep testing.  This includes her Adderall, Vyvanse, temazepam and sertraline. ?

## 2021-05-27 NOTE — Telephone Encounter (Signed)
-----   Message from Star Age, MD sent at 05/26/2021  1:01 PM EDT ----- ?Please advise patient that her home sleep test from 05/20/2021 did not show any significant sleep apnea.  She had mild intermittent snoring.  We had talked about extended sleep testing to evaluate her for an underlying sleepiness condition, for this, she would have to taper off her antidepressant and anti-anxiety medications as well as ADHD medication.  If she wishes to pursue this, she needs to talk to her prescribing physician/provider about coming off of these medications with a taper gradually. We can certainly pursue additional sleep testing when she is ready.  For now, she can follow-up with me as needed. ?

## 2021-05-27 NOTE — Telephone Encounter (Signed)
Spoke with the patient and discussed her sleep study results.  The patient verbalized understanding that if she would like to proceed with extended sleep testing she would have to taper off her antidepressant, anti-anxiety, as well as ADHD medication.  She would have to be off of these for 2 to 3 weeks before the study and this would need to be a gradual taper off that is discussed with her prescribing physician.  The patient stated her prescribing physician is currently out of the office for the next few days.  The patient would like to proceed with the testing.  I let her know I will see if we could go ahead and place the order to get the authorization process started and then she can schedule once she is tapered off the medication and will have remained off for 2 to 3 weeks. Pt verbalized appreciation for the call and her questions were answered.  ?

## 2021-05-27 NOTE — Telephone Encounter (Signed)
I called pt and let her know that the order was placed for the sleep study and MSLT by Dr. Rexene Alberts.  The sleep lab will get authorization and she will connect with her physician to taper off her medications as previously discussed.  She appreciated call back and verbalized understanding.  ?

## 2021-05-27 NOTE — Addendum Note (Signed)
Addended by: Star Age on: 05/27/2021 12:14 PM ? ? Modules accepted: Orders ? ?

## 2021-05-27 NOTE — Telephone Encounter (Signed)
I ordered PSG with next day MSLT as an addendum to her office visit note. ?

## 2021-07-06 ENCOUNTER — Encounter (HOSPITAL_BASED_OUTPATIENT_CLINIC_OR_DEPARTMENT_OTHER): Payer: Self-pay

## 2021-07-06 ENCOUNTER — Other Ambulatory Visit (HOSPITAL_BASED_OUTPATIENT_CLINIC_OR_DEPARTMENT_OTHER): Payer: Self-pay

## 2021-07-06 MED ORDER — OZEMPIC (0.25 OR 0.5 MG/DOSE) 2 MG/3ML ~~LOC~~ SOPN
PEN_INJECTOR | SUBCUTANEOUS | 0 refills | Status: DC
  Filled 2021-07-06: qty 3, 28d supply, fill #0

## 2021-07-09 ENCOUNTER — Other Ambulatory Visit (HOSPITAL_BASED_OUTPATIENT_CLINIC_OR_DEPARTMENT_OTHER): Payer: Self-pay

## 2021-07-13 ENCOUNTER — Other Ambulatory Visit (HOSPITAL_BASED_OUTPATIENT_CLINIC_OR_DEPARTMENT_OTHER): Payer: Self-pay

## 2021-07-23 ENCOUNTER — Other Ambulatory Visit (HOSPITAL_BASED_OUTPATIENT_CLINIC_OR_DEPARTMENT_OTHER): Payer: Self-pay

## 2021-08-16 DIAGNOSIS — D500 Iron deficiency anemia secondary to blood loss (chronic): Secondary | ICD-10-CM | POA: Insufficient documentation

## 2021-08-16 DIAGNOSIS — E119 Type 2 diabetes mellitus without complications: Secondary | ICD-10-CM | POA: Insufficient documentation

## 2021-08-16 DIAGNOSIS — E559 Vitamin D deficiency, unspecified: Secondary | ICD-10-CM | POA: Insufficient documentation

## 2021-09-22 ENCOUNTER — Telehealth: Payer: Self-pay

## 2021-09-22 NOTE — Telephone Encounter (Signed)
LVM for pt to call back to let sleep lab know if she would like to proceed with sleep studies.   

## 2021-09-30 NOTE — Telephone Encounter (Signed)
We have attempted to call the patient two times to schedule sleep study.  Patient has been unavailable at the phone numbers we have on file and has not returned our calls. If patient calls back we will schedule them for their sleep study.  

## 2021-11-30 ENCOUNTER — Emergency Department (HOSPITAL_BASED_OUTPATIENT_CLINIC_OR_DEPARTMENT_OTHER): Payer: Managed Care, Other (non HMO)

## 2021-11-30 ENCOUNTER — Emergency Department (HOSPITAL_BASED_OUTPATIENT_CLINIC_OR_DEPARTMENT_OTHER)
Admission: EM | Admit: 2021-11-30 | Discharge: 2021-11-30 | Disposition: A | Discharge status: Home / Self Care | Payer: Managed Care, Other (non HMO) | Attending: Emergency Medicine | Admitting: Emergency Medicine

## 2021-11-30 ENCOUNTER — Encounter (HOSPITAL_BASED_OUTPATIENT_CLINIC_OR_DEPARTMENT_OTHER): Payer: Self-pay | Admitting: *Deleted

## 2021-11-30 ENCOUNTER — Other Ambulatory Visit: Payer: Self-pay

## 2021-11-30 DIAGNOSIS — M79662 Pain in left lower leg: Secondary | ICD-10-CM

## 2021-11-30 DIAGNOSIS — M79605 Pain in left leg: Secondary | ICD-10-CM | POA: Insufficient documentation

## 2021-11-30 DIAGNOSIS — Z8616 Personal history of COVID-19: Secondary | ICD-10-CM | POA: Insufficient documentation

## 2021-11-30 DIAGNOSIS — Z794 Long term (current) use of insulin: Secondary | ICD-10-CM | POA: Insufficient documentation

## 2021-11-30 NOTE — ED Notes (Signed)
Patient transported to Ultrasound 

## 2021-11-30 NOTE — ED Provider Notes (Signed)
[facility] EMERGENCY DEPT Provider Note   CSN: [id number] Arrival date & time: 11/30/21  1452     History  Chief Complaint  Patient presents with   Leg Pain    Kaitlyn Alvarez is a 31 y.o. female with past medical history significant for anxiety, migraines, ADHD who presents with concern for left calf pain which began Saturday morning, patient reports that it is more sore when she is walking downstairs, no significant pain at rest.  Patient was seen evaluated at walk-in clinic/urgent care, they reported slight increased diameter of left calf compared to right and sent patient to rule out DVT given her recent diagnosis of COVID.  She was placed on antivirals.  Patient denies any shortness of breath, hemoptysis, recent travel, previous blood clots, oral contraceptives, chest pain, ongoing fever, chills.  She does take Adderall and had previously been taking Vyvanse.   Leg Pain      Home Medications Prior to Admission medications   Medication Sig Start Date End Date Taking? Authorizing Provider  amphetamine-dextroamphetamine (ADDERALL) 10 MG tablet Take 1 tablet by mouth once a day for 90 days 05/11/21     OnabotulinumtoxinA (BOTOX IM) Inject into the muscle. For migraines    [provider]  ondansetron (ZOFRAN) 4 MG tablet Take 4 mg by mouth as needed for nausea or vomiting.    [provider]  rizatriptan (MAXALT) 10 MG tablet Take 10 mg by mouth as needed for migraine. May repeat in 2 hours if needed    [provider]  Semaglutide,0.25 or 0.5MG /DOS, (OZEMPIC, 0.25 OR 0.5 MG/DOSE,) 2 MG/1.5ML SOPN Inject 0.25 mg Subcutaneous Once a week 30 days 03/30/21     Semaglutide,0.25 or 0.5MG /DOS, (OZEMPIC, 0.25 OR 0.5 MG/DOSE,) 2 MG/3ML SOPN Inject 0.5mg  under the skin once weekly 07/06/21   Madelin Rear, MD  sertraline (ZOLOFT) 100 MG tablet Take 100 mg by mouth daily. 04/24/21   [provider]  TEMAZEPAM PO Take 10 mg by mouth daily.     [provider]  VYVANSE 60 MG capsule Take 70 mg by mouth daily.  12/06/15   [provider]      Allergies    Patient has no known allergies.    Review of Systems   Review of Systems  Cardiovascular:  Positive for leg swelling.  All other systems reviewed and are negative.   Physical Exam Updated Vital Signs BP 103/67   Pulse 86   Temp 98 F (36.7 C) (Oral)   Resp 16   Wt 102.5 kg   LMP 11/09/2021 (Approximate)   SpO2 96%   BMI 37.61 kg/m  Physical Exam Vitals and nursing note reviewed.  Constitutional:      General: She is not in acute distress.    Appearance: Normal appearance.  HENT:     Head: Normocephalic and atraumatic.  Eyes:     General:        Right eye: No discharge.        Left eye: No discharge.  Cardiovascular:     Rate and Rhythm: Normal rate and regular rhythm.     Heart sounds: No murmur heard.    No friction rub. No gallop.     Comments: Occasional tachycardia with patient raises head off of bed, but tachycardia resolves with rest, normal heart rhythm Pulmonary:     Effort: Pulmonary effort is normal.     Breath sounds: Normal breath sounds.  Abdominal:     General: Bowel sounds  are normal.     Palpations: Abdomen is soft.  Musculoskeletal:     Comments: Minimal tenderness of posterior left calf without redness, or significant swelling.  Skin:    General: Skin is warm and dry.     Capillary Refill: Capillary refill takes less than 2 seconds.  Neurological:     Mental Status: She is alert and oriented to person, place, and time.  Psychiatric:        Mood and Affect: Mood normal.        Behavior: Behavior normal.    ED Results / Procedures / Treatments   Labs (all labs ordered are listed, but only abnormal results are displayed) Labs Reviewed - No data to display  EKG None  Radiology US Venous Img Lower Unilateral Left  Result Date: 11/30/2021 CLINICAL DATA:  r/o dvt.  post covid.  Left calf pain and edema. EXAM:  Left LOWER EXTREMITY VENOUS DOPPLER ULTRASOUND TECHNIQUE: Gray-scale sonography with compression, as well as color and duplex ultrasound, were performed to evaluate the deep venous system(s) from the level of the common femoral vein through the popliteal and proximal calf veins. COMPARISON:  None Available. FINDINGS: VENOUS Normal compressibility of the common femoral, superficial femoral, and popliteal veins, as well as the visualized calf veins. Visualized portions of profunda femoral vein and great saphenous vein unremarkable. No filling defects to suggest DVT on grayscale or color Doppler imaging. Doppler waveforms show normal direction of venous flow, normal respiratory plasticity and response to augmentation. Limited views of the contralateral common femoral vein are unremarkable. OTHER None. Limitations: none IMPRESSION: No deep venous thrombosis of the left lower extremity. Electronically Signed   By: Tish Frederickson M.D.   On: 11/30/2021 17:15    Procedures Procedures    Medications Ordered in ED Medications - No data to display  ED Course/ Medical Decision Making/ A&P                           Medical Decision Making  This is an overall well-appearing 31 year old female who presents with concern for left leg pain, actionable swelling, and was told to come for evaluation of DVT secondary to recent COVID diagnosis last week.  Patient with no previous history of blood clots, no recent travel, no oral birth control, no hemoptysis, shortness of breath, chest tightness, chest pain.  Independently interpreted ultrasound of the left lower extremity which shows no evidence of DVT.  Patient did have some tachycardia on arrival, however as she takes Adderall for her ADHD, as well as had recent diagnosis of COVID I have low suspicion that this isolated tachycardia is representative of a pulmonary embolism at this time, especially as she has had normal resting heart rate around 80-90 since her initial  reading.  Based on my physical exam I am suspicious the patient may have a minor sprain or strain of calf muscle, encouraged RICE, ibuprofen, Tylenol, and close PCP follow-up as needed.  Patient discharged in stable condition at this time, return precautions given. Final Clinical Impression(s) / ED Diagnoses Final diagnoses:  Pain of left calf    Rx / DC Orders ED Discharge Orders     None         [place] 11/30/21 1725    Gwyneth Sprout, MD 12/03/21 1735

## 2021-11-30 NOTE — ED Notes (Signed)
Patient verbalizes understanding of discharge instructions. Opportunity for questioning and answers were provided. Patient discharged from ED.  °

## 2021-11-30 NOTE — ED Triage Notes (Signed)
Pt is here for left calf pain which began Saturday morning.  Pt was dx with covid on Tuesday and placed on antivirals.  Pt was seen at walk in clinic and they sent her here for r/o dvt. No sob

## 2021-11-30 NOTE — Discharge Instructions (Signed)
We did not see any evidence of a blood clot in your leg on your exam today.  My suspicion is that you may have had a small muscle sprain or strain in the calf.  Please use Tylenol or ibuprofen for pain.  You may use 600 mg ibuprofen every 6 hours or 1000 mg of Tylenol every 6 hours.  You may choose to alternate between the 2.  This would be most effective.  Not to exceed 4 g of Tylenol within 24 hours.  Not to exceed 3200 mg ibuprofen 24 hours.

## 2022-01-25 IMAGING — US US PELVIS COMPLETE WITH TRANSVAGINAL
1 series · 14 of 25 positions shown · non-contrast
Comparison: Prior ultrasound 12/24/2012.

CLINICAL DATA: Initial evaluation for PCOS.



[Series 1: us pelvis complete with transvaginal · 0.19mm/px · 14 of 58 slices shown]
[im 1/58]
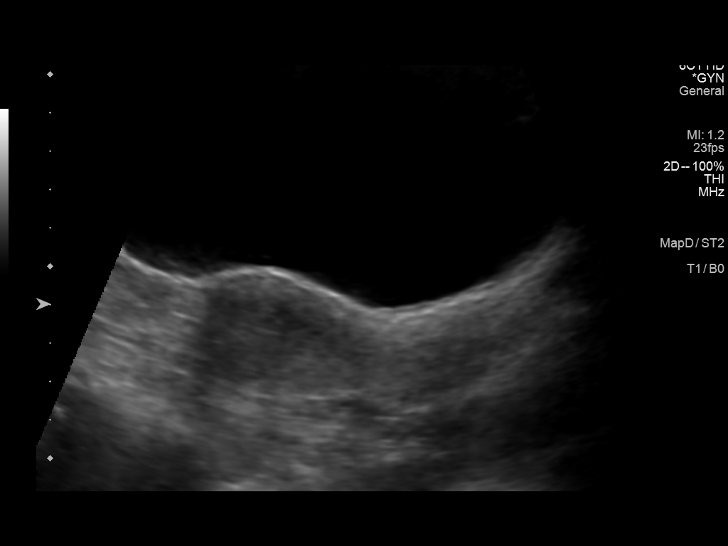
[im 5/58]
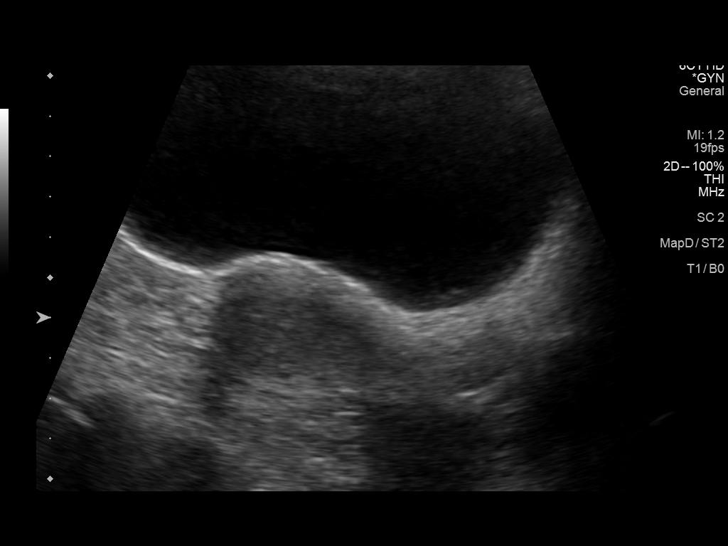
[im 10/58]
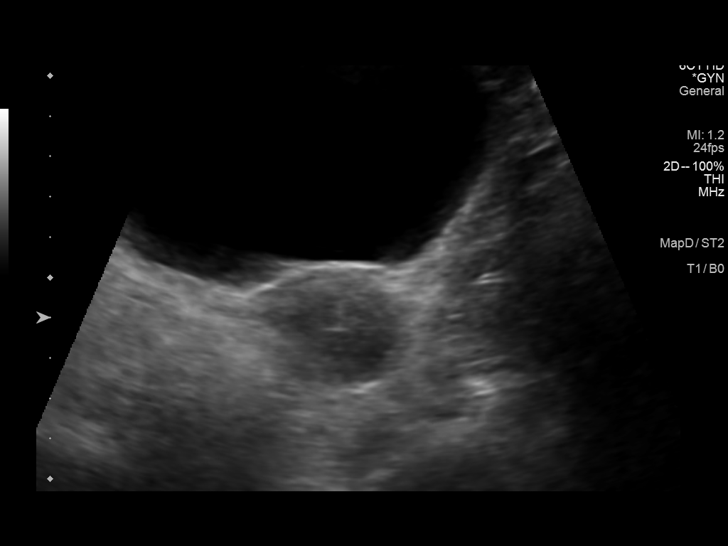
[im 15/58]
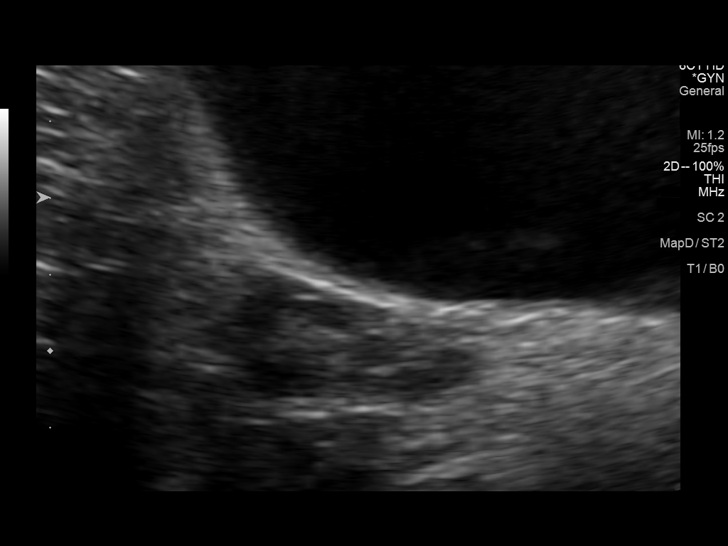
[im 20/58]
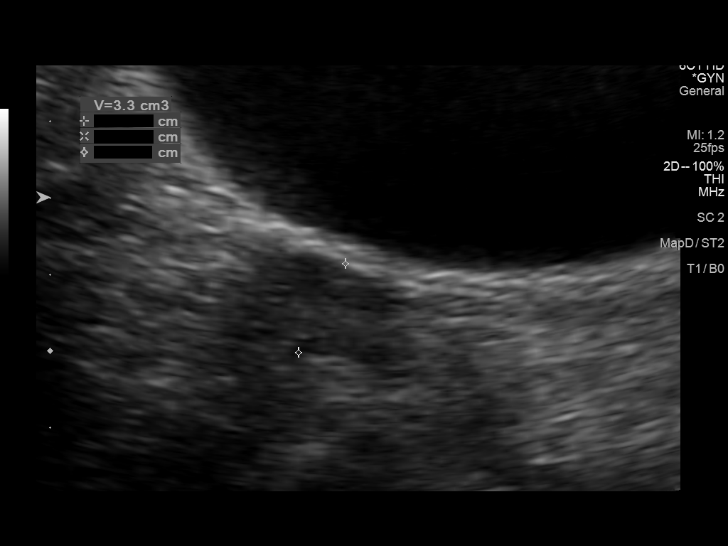
[im 22/58]
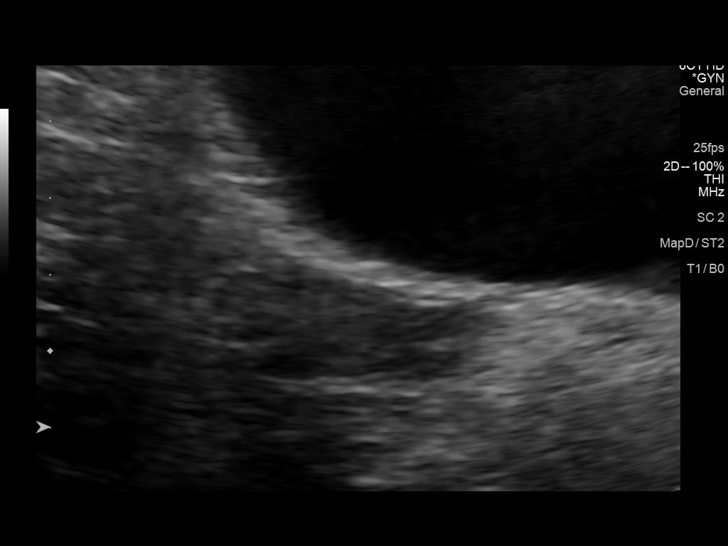
[im 27/58]
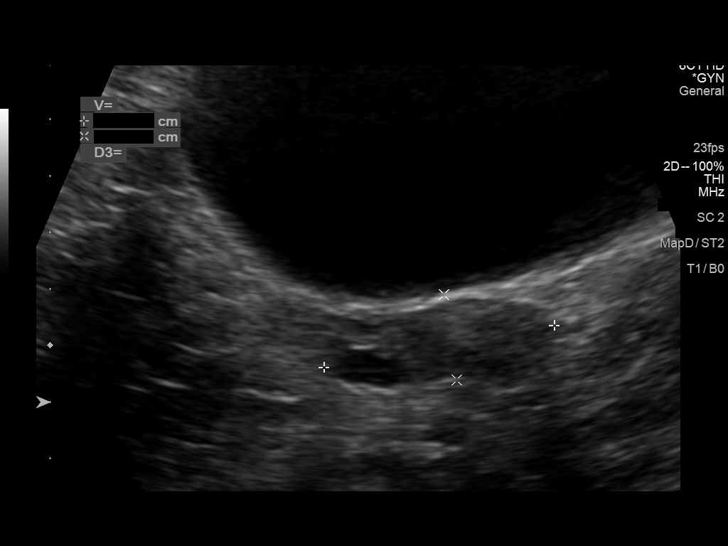
[im 31/58]
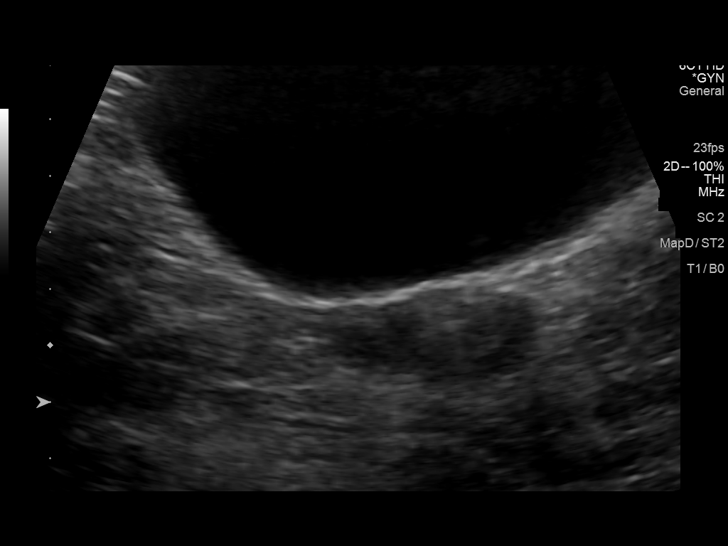
[im 36/58]
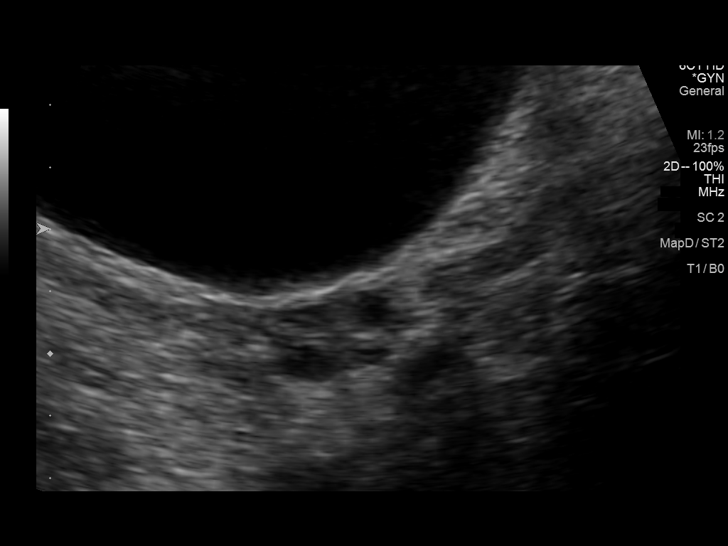
[im 39/58]
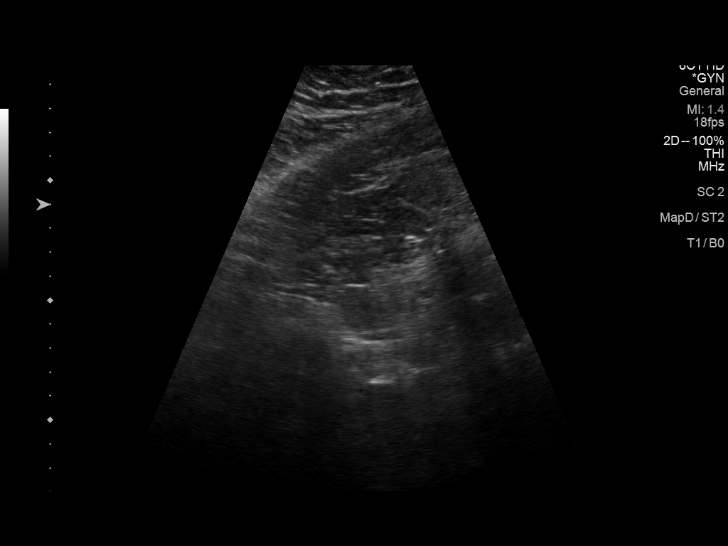
[im 43/58]
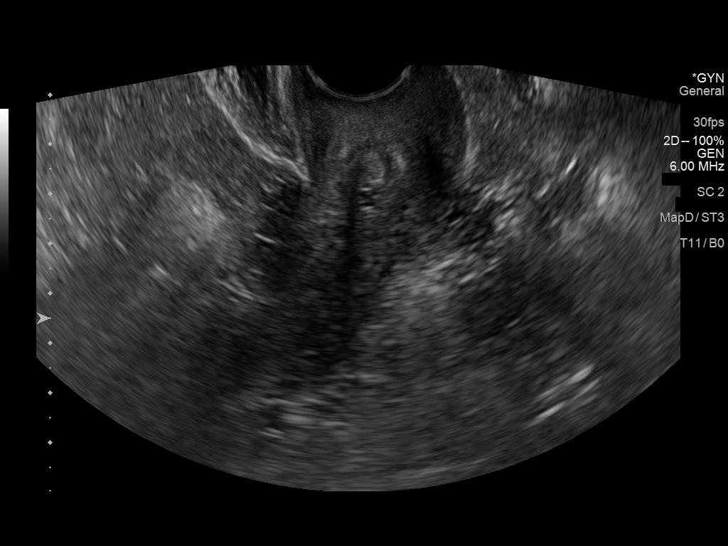
[im 48/58]
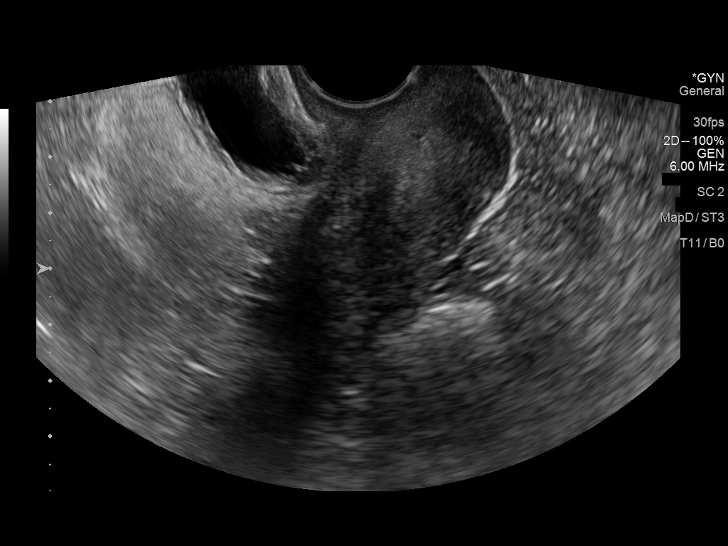
[im 53/58]
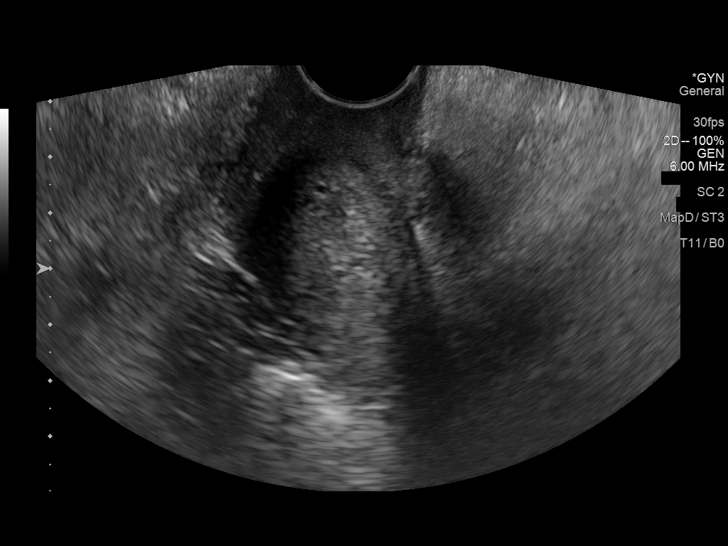
[im 58/58]
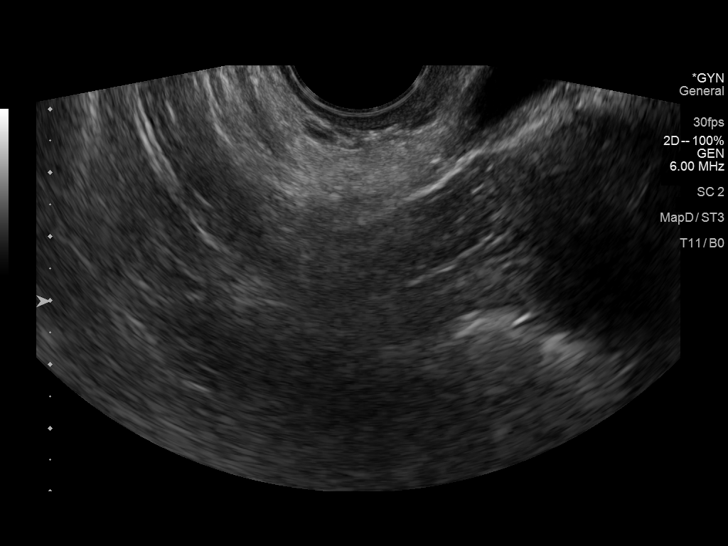

[14 of 25 positions shown; findings below may reference images not displayed]

FINDINGS: Uterus

Measurements: 7.1 x 2.8 x 3.2 cm = volume: 33.9 mL. No fibroids or
other mass visualized.

Endometrium

Thickness: 4.2 mm.  No focal abnormality visualized.

Right ovary

Measurements: 3.4 x 1.4 x 1.3 cm = volume: 3.3 mL. Normal
appearance/no adnexal mass. No sonographic features of PCOS.

Left ovary

Measurements: 4.1 x 1.5 x 1.6 cm = volume: 5.3 mL. Normal
appearance/no adnexal mass. No sonographic features of PCOS.

Other findings

No abnormal free fluid.
IMPRESSION: Normal pelvic ultrasound. No sonographic features of PCOS.

## 2022-06-10 ENCOUNTER — Other Ambulatory Visit (HOSPITAL_BASED_OUTPATIENT_CLINIC_OR_DEPARTMENT_OTHER): Payer: Self-pay

## 2022-06-10 MED ORDER — LISDEXAMFETAMINE DIMESYLATE 70 MG PO CAPS
70.0000 mg | ORAL_CAPSULE | Freq: Every morning | ORAL | 0 refills | Status: DC
  Filled 2022-06-10: qty 30, 30d supply, fill #0

## 2022-06-25 ENCOUNTER — Encounter (HOSPITAL_BASED_OUTPATIENT_CLINIC_OR_DEPARTMENT_OTHER): Payer: Self-pay

## 2022-06-25 DIAGNOSIS — R000 Tachycardia, unspecified: Secondary | ICD-10-CM | POA: Insufficient documentation

## 2022-06-25 DIAGNOSIS — L659 Nonscarring hair loss, unspecified: Secondary | ICD-10-CM | POA: Insufficient documentation

## 2022-06-30 ENCOUNTER — Ambulatory Visit (HOSPITAL_BASED_OUTPATIENT_CLINIC_OR_DEPARTMENT_OTHER): Payer: BC Managed Care – PPO | Admitting: Cardiology

## 2022-06-30 ENCOUNTER — Other Ambulatory Visit (INDEPENDENT_AMBULATORY_CARE_PROVIDER_SITE_OTHER): Payer: BC Managed Care – PPO

## 2022-06-30 ENCOUNTER — Encounter (HOSPITAL_BASED_OUTPATIENT_CLINIC_OR_DEPARTMENT_OTHER): Payer: Self-pay | Admitting: Cardiology

## 2022-06-30 VITALS — BP 124/64 | HR 89 | Ht 65.0 in | Wt 201.0 lb

## 2022-06-30 DIAGNOSIS — Q796 Ehlers-Danlos syndrome, unspecified: Secondary | ICD-10-CM

## 2022-06-30 DIAGNOSIS — R9431 Abnormal electrocardiogram [ECG] [EKG]: Secondary | ICD-10-CM

## 2022-06-30 DIAGNOSIS — R002 Palpitations: Secondary | ICD-10-CM | POA: Diagnosis not present

## 2022-06-30 MED ORDER — METOPROLOL TARTRATE 25 MG PO TABS: ORAL_TABLET | ORAL | 3 refills | Status: AC

## 2022-06-30 NOTE — Patient Instructions (Addendum)
Medication Instructions:  Your physician has recommended you make the following change in your medication:   Start: Metoprolol Tartrate 25mg  twice daily as needed for heart palpitations    Testing/Procedures: Your physician has requested that you have an echocardiogram. Echocardiography is a painless test that uses sound waves to create images of your heart. It provides your doctor with information about the size and shape of your heart and how well your heart's chambers and valves are working. This procedure takes approximately one hour. There are no restrictions for this procedure. Please do NOT wear cologne, perfume, aftershave, or lotions (deodorant is allowed). Please arrive 15 minutes prior to your appointment time.  Your physician has recommended that you wear a Zio monitor.   This monitor is a medical device that records the heart's electrical activity. Doctors most often use these monitors to diagnose arrhythmias. Arrhythmias are problems with the speed or rhythm of the heartbeat. The monitor is a small device applied to your chest. You can wear one while you do your normal daily activities. While wearing this monitor if you have any symptoms to push the button and record what you felt. Once you have worn this monitor for the period of time provider prescribed (Usually 14 days), you will return the monitor device in the postage paid box. Once it is returned they will download the data collected and provide [place] with a report which the provider will then review and we will call you with those results. Important tips:  Avoid showering during the first 24 hours of wearing the monitor. Avoid excessive sweating to help maximize wear time. Do not submerge the device, no hot tubs, and no swimming pools. Keep any lotions or oils away from the patch. After 24 hours you may shower with the patch on. Take brief showers with your back facing the shower head.  Do not remove patch once it has been  placed because that will interrupt data and decrease adhesive wear time. Push the button when you have any symptoms and write down what you were feeling. Once you have completed wearing your monitor, remove and place into box which has postage paid and place in your outgoing mailbox.  If for some reason you have misplaced your box then call our office and we can provide another box and/or mail it off for you   Follow-Up: At [facility], you and your health needs are our priority.  As part of our continuing mission to provide you with exceptional heart care, we have created designated Provider Care Teams.  These Care Teams include your primary Cardiologist (physician) and Advanced Practice Providers (APPs -  Physician Assistants and Nurse Practitioners) who all work together to provide you with the care you need, when you need it.  We recommend signing up for the patient portal called "MyChart".  Sign up information is provided on this After Visit Summary.  MyChart is used to connect with patients for Virtual Visits (Telemedicine).  Patients are able to view lab/test results, encounter notes, upcoming appointments, etc.  Non-urgent messages can be sent to your provider as well.   To learn more about what you can do with MyChart, go to [email or web address].    Your next appointment:   PRN based on  monitor results

## 2022-06-30 NOTE — Addendum Note (Signed)
Addended by: Marlene Lard on: 06/30/2022 10:37 AM   Modules accepted: Orders

## 2022-06-30 NOTE — Progress Notes (Signed)
Cardiology Office Note:    Date:  06/30/2022   ID:  Kaitlyn Alvarez, DOB 09-29-1990, MRN [id number]  PCP:  Kaitlyn Patience, FNP   [facility] Providers Cardiologist:  Kaitlyn Schultz, MD     Referring MD: Kaitlyn Stable, NP    History of Present Illness:    Kaitlyn Alvarez is a 32 y.o. female here for the evaluation of tachycardia at the request of Kaitlyn Maizes, NP.  She has had recent increasing fatigue.  Heart rate has been in the 100s to 120s for quite some time.  Sometimes she feels the palpitations.  Occasionally dizzy or orthostatic.  No prior cardiac issues. +Fatigue. No OSA.  Has diabetes diagnosed in 2022 with A1c of 7.2.  Ozempic.  Hemoglobin A1c decreased to 5.4.  Lost 46 pounds. Has hypermobile Erler's Danlos syndrome.  Has not had any aortic issues.  No early family history of coronary artery disease  TSH is normal at 3.7 electrolytes are normal.  Hemoglobin 12.1 with mildly reduced MCV.  Past Medical History:  Diagnosis Date   Adenomatous colon polyp    Anxiety    Chronic fatigue    Depression    Diabetes mellitus without complication (HCC)    Excessive daytime sleepiness    GERD (gastroesophageal reflux disease)    Hypermobile Ehlers-Danlos syndrome    Menometrorrhagia    Migraines    Mood disorder (HCC)    Obesity    PCOS (polycystic ovarian syndrome)     Past Surgical History:  Procedure Laterality Date   TONSILLECTOMY  2011    Current Medications: Current Meds  Medication Sig   ergocalciferol (VITAMIN D2) 1.25 MG (50000 UT) capsule Take 50,000 Units by mouth once a week.   ibuprofen (ADVIL) 800 MG tablet Take 800 mg by mouth as needed.   lisdexamfetamine (VYVANSE) 70 MG capsule Take 1 capsule (70 mg total) by mouth every morning.   metoprolol tartrate (LOPRESSOR) 25 MG tablet Metoprolol Tartrate 25mg  twice daily as needed for palpitations   pantoprazole (PROTONIX) 40 MG tablet Take 40 mg by mouth daily.   rizatriptan (MAXALT) 10 MG  tablet Take 10 mg by mouth as needed for migraine. May repeat in 2 hours if needed   Semaglutide, 1 MG/DOSE, (OZEMPIC, 1 MG/DOSE,) 4 MG/3ML SOPN INJECT 1 MG SUBCUTANEOUS ONCE A WEEK   sertraline (ZOLOFT) 100 MG tablet Take 100 mg by mouth daily.   temazepam (RESTORIL) 30 MG capsule Take 1 capsule by mouth daily.     Allergies:   Patient has no known allergies.   Social History   Socioeconomic History   Marital status: Single    Spouse name: Not on file   Number of children: Not on file   Years of education: Not on file   Highest education level: Not on file  Occupational History   Not on file  Tobacco Use   Smoking status: Never   Smokeless tobacco: Never  Substance and Sexual Activity   Alcohol use: Yes    Alcohol/week: 2.0 standard drinks of alcohol    Types: 2 Shots of liquor per week    Comment: occasionally- 2 drinks a week   Drug use: No   Sexual activity: Yes    Birth control/protection: None  Other Topics Concern   Not on file  Social History Narrative   Not on file   Social Determinants of Health   Financial Resource Strain: Not on file  Food Insecurity: Not on file  Transportation  Needs: Not on file  Physical Activity: Not on file  Stress: Not on file  Social Connections: Not on file     Family History: The patient's family history includes ADD / ADHD in her brother; Diabetes in her father; Obstructive Sleep Apnea in her father; Sleep disorder in her mother.  ROS:   Please see the history of present illness.    No syncope fevers chills nausea vomiting all other systems reviewed and are negative.  EKGs/Labs/Other Studies Reviewed:    The following studies were reviewed today: Cardiac Studies & Procedures       ECHOCARDIOGRAM  ECHOCARDIOGRAM COMPLETE 01/11/2019  Narrative ECHOCARDIOGRAM REPORT    Patient Name:   Kaitlyn Alvarez Date of Exam: 01/11/2019 Medical Rec #:  [id number]       Height:       65.0 in Accession #:    [id number]       Weight:       193.4 lb Date of Birth:  21-Aug-1990        BSA:          1.95 m Patient Age:    28 years        BP:           110/70 mmHg Patient Gender: F               HR:           127 bpm. Exam Location:  [address]  Procedure: 2D Echo, 3D Echo, Color Doppler and Cardiac Doppler  Indications:    Q79.60 Ehlers-Danlos Disease  History:        Patient has prior history of Echocardiogram examinations, most recent 01/22/2016. Abnormal ECG; Signs/Symptoms:Chest Pain and Dizziness/Lightheadedness. Palpitations, Migraine Headaches, Prolonged Q-T interval, Ehlers-Danlos Syndrome.  Sonographer:    Farrel Conners RDCS Referring Phys: [id number] Kaitlyn Alvarez  IMPRESSIONS   1. Left ventricular ejection fraction, by visual estimation, is 60 to 65%. The left ventricle has normal function. There is no left ventricular hypertrophy. 2. Global right ventricle has normal systolic function.The right ventricular size is normal. 3. Left atrial size was normal. 4. Right atrial size was normal. 5. The mitral valve is normal in structure. No evidence of mitral valve regurgitation. No evidence of mitral stenosis. 6. The tricuspid valve is normal in structure. Tricuspid valve regurgitation is trivial. 7. The aortic valve has an indeterminant number of cusps. Aortic valve regurgitation is not visualized. No evidence of aortic valve sclerosis or stenosis. 8. The pulmonic valve was not well visualized. Pulmonic valve regurgitation is not visualized. 9. The inferior vena cava is normal in size with greater than 50% respiratory variability, suggesting right atrial pressure of 3 mmHg. 10. Technically difficult; normal LV function; no significant valvular abnormality.  FINDINGS Left Ventricle: Left ventricular ejection fraction, by visual estimation, is 60 to 65%. The left ventricle has normal function. There is no left ventricular hypertrophy. Left ventricular diastolic parameters were normal. Normal left  atrial pressure.  Right Ventricle: The right ventricular size is normal.Global RV systolic function is has normal systolic function.  Left Atrium: Left atrial size was normal in size.  Right Atrium: Right atrial size was normal in size  Pericardium: There is no evidence of pericardial effusion.  Mitral Valve: The mitral valve is normal in structure. No evidence of mitral valve stenosis by observation. No evidence of mitral valve regurgitation.  Tricuspid Valve: The tricuspid valve is normal in structure. Tricuspid valve regurgitation is trivial.  Aortic Valve: The  aortic valve has an indeterminant number of cusps. Aortic valve regurgitation is not visualized. The aortic valve is structurally normal, with no evidence of sclerosis or stenosis.  Pulmonic Valve: The pulmonic valve was not well visualized. Pulmonic valve regurgitation is not visualized.  Aorta: The aortic root is normal in size and structure.  Venous: The inferior vena cava is normal in size with greater than 50% respiratory variability, suggesting right atrial pressure of 3 mmHg.   LEFT VENTRICLE PLAX 2D LVIDd:         3.57 cm  Diastology LVIDs:         2.04 cm  LV e' lateral:   22.50 cm/s LV PW:         0.91 cm  LV E/e' lateral: 4.5 LV IVS:        0.94 cm  LV e' medial:    10.80 cm/s LVOT diam:     2.50 cm  LV E/e' medial:  9.4 LV SV:         40 ml LV SV Index:   19.61 LVOT Area:     4.91 cm   RIGHT VENTRICLE RV S prime:     14.20 cm/s TAPSE (M-mode): 1.5 cm  LEFT ATRIUM           Index       RIGHT ATRIUM          Index LA diam:      2.40 cm 1.23 cm/m  RA Area:     6.49 cm LA Vol (A4C): 22.4 ml 11.49 ml/m RA Volume:   10.80 ml 5.54 ml/m AORTIC VALVE LVOT Vmax:   120.00 cm/s LVOT Vmean:  86.000 cm/s LVOT VTI:    0.232 m  AORTA Ao Root diam: 3.00 cm Ao Asc diam:  2.70 cm  MITRAL VALVE MV Area (PHT): cm                   SHUNTS MV PHT:        msec                  Systemic VTI:  0.23 m MV Decel  Time: 214 msec              Systemic Diam: 2.50 cm MV E velocity: 102.00 cm/s 103 cm/s MV A velocity: 89.50 cm/s  70.3 cm/s MV E/A ratio:  1.14        1.5   Olga Millers MD Electronically signed by Olga Millers MD Signature Date/Time: 01/11/2019/4:34:12 PM    Final              EKG: Sinus rhythm 89 bpm with low voltage.  Recent Labs: No results found for requested labs within last 365 days.  Recent Lipid Panel No results found for: "CHOL", "TRIG", "HDL", "CHOLHDL", "VLDL", "LDLCALC", "LDLDIRECT"   Risk Assessment/Calculations:               Physical Exam:    VS:  BP 124/64   Pulse 89   Ht 5\' 5"  (1.651 m)   Wt 201 lb (91.2 kg)   LMP 06/21/2022   SpO2 98%   BMI 33.45 kg/m     Wt Readings from Last 3 Encounters:  06/30/22 201 lb (91.2 kg)  11/30/21 226 lb (102.5 kg)  04/29/21 242 lb (109.8 kg)     GEN:  Well nourished, well developed in no acute distress HEENT: Normal NECK: No JVD; No carotid bruits LYMPHATICS: No lymphadenopathy CARDIAC: RRR, no  murmurs, rubs, gallops RESPIRATORY:  Clear to auscultation without rales, wheezing or rhonchi  ABDOMEN: Soft, non-tender, non-distended MUSCULOSKELETAL:  No edema; No deformity  SKIN: Warm and dry NEUROLOGIC:  Alert and oriented x 3 PSYCHIATRIC:  Normal affect   ASSESSMENT:    1. Nonspecific abnormal electrocardiogram (ECG) (EKG)   2. Palpitations   3. Ehlers-Danlos syndrome    PLAN:    In order of problems listed above:  Palpitations, tachycardia, Erler's Danlos syndrome -We will go ahead and check echocardiogram to ensure proper structure and function and to make sure that her aorta is normal.  I will also check a Zio patch monitor to ensure proper heart rates especially at night.  It be nice to know an average heart rate. -We will give her metoprolol 25 mg twice daily as needed if she is feeling significant palpitations.  She has had prior abnormal EKGs with sinus tachycardia 123 bpm. -Continue to  encourage daily walking, exercise, caffeine reduction.  Vyvanse can increase heart rate.  Will follow-up on as-needed basis            Medication Adjustments/Labs and Tests Ordered: Current medicines are reviewed at length with the patient today.  Concerns regarding medicines are outlined above.  Orders Placed This Encounter  Procedures   LONG TERM MONITOR (3-14 DAYS)   ECHOCARDIOGRAM COMPLETE   Meds ordered this encounter  Medications   metoprolol tartrate (LOPRESSOR) 25 MG tablet    Sig: Metoprolol Tartrate 25mg  twice daily as needed for palpitations    Dispense:  180 tablet    Refill:  3    Patient Instructions  Medication Instructions:  Your physician has recommended you make the following change in your medication:   Start: Metoprolol Succinate 25mg  twice daily as needed for heart palpitations    Testing/Procedures: Your physician has requested that you have an echocardiogram. Echocardiography is a painless test that uses sound waves to create images of your heart. It provides your doctor with information about the size and shape of your heart and how well your heart's chambers and valves are working. This procedure takes approximately one hour. There are no restrictions for this procedure. Please do NOT wear cologne, perfume, aftershave, or lotions (deodorant is allowed). Please arrive 15 minutes prior to your appointment time.  Your physician has recommended that you wear a Zio monitor.   This monitor is a medical device that records the heart's electrical activity. Doctors most often use these monitors to diagnose arrhythmias. Arrhythmias are problems with the speed or rhythm of the heartbeat. The monitor is a small device applied to your chest. You can wear one while you do your normal daily activities. While wearing this monitor if you have any symptoms to push the button and record what you felt. Once you have worn this monitor for the period of time provider  prescribed (Usually 14 days), you will return the monitor device in the postage paid box. Once it is returned they will download the data collected and provide [place] with a report which the provider will then review and we will call you with those results. Important tips:  Avoid showering during the first 24 hours of wearing the monitor. Avoid excessive sweating to help maximize wear time. Do not submerge the device, no hot tubs, and no swimming pools. Keep any lotions or oils away from the patch. After 24 hours you may shower with the patch on. Take brief showers with your back facing the shower head.  Do not  remove patch once it has been placed because that will interrupt data and decrease adhesive wear time. Push the button when you have any symptoms and write down what you were feeling. Once you have completed wearing your monitor, remove and place into box which has postage paid and place in your outgoing mailbox.  If for some reason you have misplaced your box then call our office and we can provide another box and/or mail it off for you   Follow-Up: At [facility], you and your health needs are our priority.  As part of our continuing mission to provide you with exceptional heart care, we have created designated Provider Care Teams.  These Care Teams include your primary Cardiologist (physician) and Advanced Practice Providers (APPs -  Physician Assistants and Nurse Practitioners) who all work together to provide you with the care you need, when you need it.  We recommend signing up for the patient portal called "MyChart".  Sign up information is provided on this After Visit Summary.  MyChart is used to connect with patients for Virtual Visits (Telemedicine).  Patients are able to view lab/test results, encounter notes, upcoming appointments, etc.  Non-urgent messages can be sent to your provider as well.   To learn more about what you can do with MyChart, go to  [email or web address].    Your next appointment:   PRN based on  monitor results     Signed, Kaitlyn Schultz, MD  06/30/2022 10:11 AM    [facility]

## 2022-07-15 ENCOUNTER — Other Ambulatory Visit (HOSPITAL_BASED_OUTPATIENT_CLINIC_OR_DEPARTMENT_OTHER): Payer: Self-pay

## 2022-07-15 ENCOUNTER — Other Ambulatory Visit: Payer: Self-pay

## 2022-07-15 MED ORDER — LISDEXAMFETAMINE DIMESYLATE 70 MG PO CAPS
ORAL_CAPSULE | ORAL | 0 refills | Status: DC
  Filled 2022-07-15: qty 30, 30d supply, fill #0

## 2022-08-03 ENCOUNTER — Ambulatory Visit (INDEPENDENT_AMBULATORY_CARE_PROVIDER_SITE_OTHER): Payer: BC Managed Care – PPO

## 2022-08-03 DIAGNOSIS — R9431 Abnormal electrocardiogram [ECG] [EKG]: Secondary | ICD-10-CM

## 2022-08-03 DIAGNOSIS — R002 Palpitations: Secondary | ICD-10-CM | POA: Diagnosis not present

## 2022-08-03 LAB — ECHOCARDIOGRAM COMPLETE
Area-P 1/2: 5.13 cm2
S' Lateral: 3.16 cm

## 2022-08-04 ENCOUNTER — Encounter (HOSPITAL_BASED_OUTPATIENT_CLINIC_OR_DEPARTMENT_OTHER): Payer: Self-pay | Admitting: Cardiology

## 2022-08-25 ENCOUNTER — Other Ambulatory Visit (HOSPITAL_BASED_OUTPATIENT_CLINIC_OR_DEPARTMENT_OTHER): Payer: Self-pay

## 2022-08-25 MED ORDER — LISDEXAMFETAMINE DIMESYLATE 70 MG PO CAPS
70.0000 mg | ORAL_CAPSULE | Freq: Every morning | ORAL | 0 refills | Status: AC
  Filled 2022-08-25: qty 30, 30d supply, fill #0

## 2023-01-11 ENCOUNTER — Encounter (HOSPITAL_COMMUNITY): Payer: Self-pay | Admitting: *Deleted

## 2023-01-11 ENCOUNTER — Ambulatory Visit (HOSPITAL_COMMUNITY)
Admission: EM | Admit: 2023-01-11 | Discharge: 2023-01-11 | Disposition: A | Discharge status: Home / Self Care | Payer: BC Managed Care – PPO | Attending: Internal Medicine | Admitting: Internal Medicine

## 2023-01-11 ENCOUNTER — Ambulatory Visit (INDEPENDENT_AMBULATORY_CARE_PROVIDER_SITE_OTHER): Payer: BC Managed Care – PPO

## 2023-01-11 DIAGNOSIS — J189 Pneumonia, unspecified organism: Secondary | ICD-10-CM

## 2023-01-11 LAB — POC COVID19/FLU A&B COMBO
Covid Antigen, POC: NEGATIVE
Influenza A Antigen, POC: NEGATIVE
Influenza B Antigen, POC: NEGATIVE

## 2023-01-11 MED ORDER — IPRATROPIUM-ALBUTEROL 0.5-2.5 (3) MG/3ML IN SOLN
3.0000 mL | Freq: Once | RESPIRATORY_TRACT | Status: AC
  Administered 2023-01-11: 3 mL via RESPIRATORY_TRACT

## 2023-01-11 MED ORDER — ALBUTEROL SULFATE HFA 108 (90 BASE) MCG/ACT IN AERS: 2.0000 | INHALATION_SPRAY | RESPIRATORY_TRACT | 0 refills | Status: DC | PRN

## 2023-01-11 MED ORDER — IPRATROPIUM-ALBUTEROL 0.5-2.5 (3) MG/3ML IN SOLN
RESPIRATORY_TRACT | Status: AC
  Filled 2023-01-11: qty 3

## 2023-01-11 MED ORDER — LEVOFLOXACIN 750 MG PO TABS: 750.0000 mg | ORAL_TABLET | Freq: Every day | ORAL | 0 refills | Status: DC

## 2023-01-11 NOTE — ED Triage Notes (Signed)
Pt states she started with cough, congestion, fever, SOB today and feels like its got worse as the day as went on. She hasn't taken any meds for sx.

## 2023-01-11 NOTE — ED Provider Notes (Addendum)
[facility]    CSN: [id number] Arrival date & time: 01/11/23  1923      History   Chief Complaint Chief Complaint  Patient presents with   Cough   Nasal Congestion   Shortness of Breath   Fever    HPI RAFFINEE FIXLER is a 32 y.o. female presents with onset of cough very mild last night, and this am did not feel bad at all and went to her derm appointment. As the day progressed her cough started getting worse and would brake out in sweats off and on. Has felt hot. Has been having cough attacks and her cough is non productive SOB started today. Per her partner, pt had a cough 2 weeks ago, but seemed resolved last week.      Past Medical History:  Diagnosis Date   Adenomatous colon polyp    Anxiety    Chronic fatigue    Depression    Diabetes mellitus without complication (HCC)    Excessive daytime sleepiness    GERD (gastroesophageal reflux disease)    Hypermobile Ehlers-Danlos syndrome    Menometrorrhagia    Migraines    Mood disorder (HCC)    Obesity    PCOS (polycystic ovarian syndrome)     Patient Active Problem List   Diagnosis Date Noted   Hair loss 06/25/2022   Tachycardia 06/25/2022   Iron deficiency anemia due to chronic blood loss 08/16/2021   Type 2 diabetes mellitus without complication (HCC) 08/16/2021   Vitamin D deficiency 08/16/2021   Ehlers-Danlos syndrome 10/04/2018   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      Home Medications    Prior to Admission medications   Medication Sig Start Date End Date Taking? Authorizing Provider  albuterol (VENTOLIN HFA) 108 (90 Base) MCG/ACT inhaler Inhale 2 puffs into the lungs every 4 (four) hours as needed for wheezing or shortness of breath. For cough attacks and SOB 01/11/23  Yes Rodriguez-Southworth, Nettie Elm, PA-C  levofloxacin (LEVAQUIN) 750 MG tablet Take 1 tablet (750 mg total) by mouth daily. 01/11/23  Yes Rodriguez-Southworth, Nettie Elm, PA-C  lisdexamfetamine (VYVANSE) 70 MG capsule Take 1 capsule (70 mg total) by mouth in the morning. 08/25/22  Yes   Semaglutide, 1 MG/DOSE, (OZEMPIC, 1 MG/DOSE,) 4 MG/3ML SOPN INJECT 1 MG SUBCUTANEOUS ONCE A WEEK   Yes [provider]  sertraline (ZOLOFT) 100 MG tablet Take 100 mg by mouth daily.   Yes [provider]  ergocalciferol (VITAMIN D2) 1.25 MG (50000 UT) capsule Take 50,000 Units by mouth once a week.    [provider]  ibuprofen (ADVIL) 800 MG tablet Take 800 mg by mouth as needed.    [provider]  metoprolol tartrate (LOPRESSOR) 25 MG tablet Metoprolol Tartrate 25mg  twice daily as needed for palpitations 06/30/22   Jake Bathe, MD  pantoprazole (PROTONIX) 40 MG tablet Take 40 mg by mouth  daily.    [provider]  rizatriptan (MAXALT) 10 MG tablet Take 10 mg by mouth as needed for migraine. May repeat in 2 hours if needed    [provider]  temazepam (RESTORIL) 30 MG capsule Take 1 capsule by mouth daily.    [provider]    Family History Family History  Problem Relation Age of Onset   Sleep disorder Mother    Obstructive Sleep Apnea Father    Diabetes Father    ADD / ADHD Brother     Social History Social History   Tobacco Use   Smoking status: Never   Smokeless tobacco: Never  Substance Use Topics   Alcohol use: Yes     Alcohol/week: 2.0 standard drinks of alcohol    Types: 2 Shots of liquor per week    Comment: occasionally- 2 drinks a week   Drug use: No     Allergies   Patient has no known allergies.   Review of Systems Review of Systems As noted in HPI  Physical Exam Triage Vital Signs ED Triage Vitals  Encounter Vitals Group     BP 01/11/23 1940 136/84     Systolic BP Percentile --      Diastolic BP Percentile --      Pulse Rate 01/11/23 1940 (!) 116     Resp 01/11/23 1940 20     Temp 01/11/23 1940 98 F (36.7 C)     Temp Source 01/11/23 1940 Oral     SpO2 01/11/23 1940 97 %     Weight --      Height --      Head Circumference --      Peak Flow --      Pain Score 01/11/23 1937 0     Pain Loc --      Pain Education --      Exclude from Growth Chart --    No data found.  Updated Vital Signs BP 136/84 (BP Location: Left Arm)   Pulse (!) 116   Temp 98 F (36.7 C) (Oral)   Resp 20   LMP 12/27/2022 (Approximate)   SpO2 97%   Visual Acuity Right Eye Distance:   Left Eye Distance:   Bilateral Distance:    Right Eye Near:   Left Eye Near:    Bilateral Near:     Physical Exam Physical Exam Vitals signs and nursing note reviewed.  Constitutional:      General: She is not in acute distress.    Appearance: Normal appearance. She is not ill-appearing, toxic-appearing or diaphoretic.  HENT:     Head: Normocephalic.     Right Ear: Tympanic membrane, ear canal and external ear normal.     Left Ear: Tympanic membrane, ear canal and external ear normal.     Nose: Nose normal.     Mouth/Throat:     Mouth: Mucous membranes are moist.  Eyes:     General: No scleral icterus.       Right eye: No discharge.        Left eye: No discharge.     Conjunctiva/sclera: Conjunctivae normal.  Neck:     Musculoskeletal: Neck supple. No neck rigidity.  Cardiovascular:     Rate and Rhythm: Normal rate and regular rhythm.     Heart sounds: No murmur.  Pulmonary:     Effort: Pulmonary  effort is normal.     Breath sounds: Normal breath sounds.    Musculoskeletal: Normal range  of motion.  Lymphadenopathy:     Cervical: No cervical adenopathy.  Skin:    General: Skin is warm and dry.     Coloration: Skin is not jaundiced.     Findings: No rash.  Neurological:     Mental Status: She is alert and oriented to person, place, and time.     Gait: Gait normal.  Psychiatric:        Mood and Affect: Mood normal.        Behavior: Behavior normal.        Thought Content: Thought content normal.        Judgment: Judgment normal.     UC Treatments / Results  Labs (all labs ordered are listed, but only abnormal results are displayed) Labs Reviewed  POC COVID19/FLU A&B COMBO  Covid and flu test are negative  EKG   Radiology No results found. Preliminary shows RLL pneumonia  Procedures Procedures (including critical care time)  Medications Ordered in UC Medications  ipratropium-albuterol (DUONEB) 0.5-2.5 (3) MG/3ML nebulizer solution 3 mL (3 mLs Nebulization Given 01/11/23 2018)    Initial Impression / Assessment and Plan / UC Course  I have reviewed the triage vital signs and the nursing notes. She was given a Duoneb treatment which helped calm down her cough attacks.  Pertinent labs & imaging results that were available during my care of the patient were reviewed by me and considered in my medical decision making (see chart for details).   RLL pneumonia  Pt was placed on Levaquin and Ventolin inhaler as needed.  May take Mucinex as directed in the box prn.   Final Clinical Impressions(s) / UC Diagnoses   Final diagnoses:  Pneumonia of right lower lobe due to infectious organism   Discharge Instructions   None    ED Prescriptions     Medication Sig Dispense Auth. Provider   levofloxacin (LEVAQUIN) 750 MG tablet Take 1 tablet (750 mg total) by mouth daily. 5 tablet Rodriguez-Southworth, Nettie Elm, PA-C   albuterol (VENTOLIN HFA) 108 (90 Base) MCG/ACT  inhaler Inhale 2 puffs into the lungs every 4 (four) hours as needed for wheezing or shortness of breath. For cough attacks and SOB 1 each Rodriguez-Southworth, Nettie Elm, PA-C      PDMP not reviewed this encounter.   Garey Ham, Cordelia Poche 01/11/23 2029    Rodriguez-SouthworthNettie Elm, PA-C 01/11/23 2034

## 2023-01-15 ENCOUNTER — Other Ambulatory Visit: Payer: Self-pay

## 2023-01-15 ENCOUNTER — Ambulatory Visit (HOSPITAL_COMMUNITY)
Admission: RE | Admit: 2023-01-15 | Discharge: 2023-01-15 | Disposition: A | Discharge status: Home / Self Care | Payer: BC Managed Care – PPO | Source: Ambulatory Visit | Attending: Family Medicine | Admitting: Family Medicine

## 2023-01-15 ENCOUNTER — Telehealth (HOSPITAL_COMMUNITY): Payer: Self-pay

## 2023-01-15 ENCOUNTER — Encounter (HOSPITAL_COMMUNITY): Payer: Self-pay

## 2023-01-15 ENCOUNTER — Ambulatory Visit (INDEPENDENT_AMBULATORY_CARE_PROVIDER_SITE_OTHER): Payer: Managed Care, Other (non HMO)

## 2023-01-15 VITALS — BP 121/87 | HR 98 | Temp 98.1°F | Resp 18

## 2023-01-15 DIAGNOSIS — R051 Acute cough: Secondary | ICD-10-CM

## 2023-01-15 DIAGNOSIS — R202 Paresthesia of skin: Secondary | ICD-10-CM | POA: Diagnosis not present

## 2023-01-15 DIAGNOSIS — R112 Nausea with vomiting, unspecified: Secondary | ICD-10-CM

## 2023-01-15 MED ORDER — HYDROCOD POLI-CHLORPHE POLI ER 10-8 MG/5ML PO SUER: 5.0000 mL | Freq: Two times a day (BID) | ORAL | 0 refills | Status: DC | PRN

## 2023-01-15 MED ORDER — ONDANSETRON 4 MG PO TBDP: 4.0000 mg | ORAL_TABLET | Freq: Three times a day (TID) | ORAL | 0 refills | Status: DC | PRN

## 2023-01-15 MED ORDER — ONDANSETRON 4 MG PO TBDP: 4.0000 mg | ORAL_TABLET | Freq: Three times a day (TID) | ORAL | 0 refills | Status: AC | PRN

## 2023-01-15 MED ORDER — CETIRIZINE HCL 10 MG PO TABS: 10.0000 mg | ORAL_TABLET | Freq: Every day | ORAL | 0 refills | Status: DC

## 2023-01-15 MED ORDER — HYDROCOD POLI-CHLORPHE POLI ER 10-8 MG/5ML PO SUER: 5.0000 mL | Freq: Two times a day (BID) | ORAL | 0 refills | Status: AC | PRN

## 2023-01-15 NOTE — Telephone Encounter (Signed)
This RN returned pt's phone call after leaving message with reception. Name and date of birth confirmed. Pt voiced she was seen today and would like her prescriptions sent to [facility] on [address] The prescribed Tussionex was out of stock at pharmacy originally on file.  This RN spoke to the MD in office. Eniola MD sent prescriptions to pt's preferred pharmacy. The prescriptions originally sent to [facility] was cancelled. Pt verbalized understanding. No further questions/concerns at this time.

## 2023-01-15 NOTE — Discharge Instructions (Addendum)
It was nice seeing you. I am sorry about your cough. Your chest xray in negative. I have sent in Tussionex prn cough as well as Zofran prn nausea and vomiting for you.  Do not use Tussionex with other cough medicine. Continue Albuterol as needed and Trial Zyrtec for nightly cough. Please see [place] or your PCP soon if symptoms persist.  As mentioned, Levaquin may cause peripheral neuropathy. Let us see if your tingling will resolve once you stop this medication. Otherwise, follow up soon for more testing.  Stay well.

## 2023-01-15 NOTE — ED Provider Notes (Addendum)
[facility]    CSN: [id number] Arrival date & time: 01/15/23  1216      History   Chief Complaint Chief Complaint  Patient presents with   Cough    I was at the clinic in Tuesday evening and was diagnosed with pneumonia. Today is the last day of my antibiotic and I'm still getting worse. Continuing to have low grade fevers on and off, hands tingling, coughing, vomiting, etc. - Entered by patient    HPI Kaitlyn Alvarez is a 32 y.o. female.   The history is provided by the patient. No language interpreter was used.  Cough Cough characteristics:  Non-productive (Was productive a while ago but better now. recently treated for pneumonia with Levaquin. Last dose is today. Still coughing) Severity:  Moderate Onset quality:  Gradual Duration:  4 days Timing:  Intermittent Progression:  Improving (About an hour ago, her symptoms was bad, but better now.) Context: not animal exposure and not sick contacts   Relieved by:  Nothing Worsened by:  Nothing (Mostly worse at night time) Associated symptoms: no chest pain and no fever   Associated symptoms comment:  She had low grade fever last night 99.5. Chest pain on and off with excessive coughing. Sometimes SOB when she talks.  Emesis Severity:  Moderate Duration:  1 day Timing:  Intermittent Number of daily episodes:  3 vomitus yesterday. None today, but still feel nauseous Progression:  Improving Chronicity:  New Relieved by:  Nothing Worsened by:  Nothing Associated symptoms: cough   Associated symptoms: no abdominal pain, no diarrhea and no fever   Associated symptoms comment:  She had mild abdominal pain yesterday but none today She used albuterol prn and Tessalon prn with no improvement. Today is her last dose of Levaquin.  Tingling sensation: C/O tingling sensation in her hands and leg, which started 3 days ago. This has been intermittent and is currently not present.   Past Medical History:  Diagnosis Date    Adenomatous colon polyp    Anxiety    Chronic fatigue    Depression    Diabetes mellitus without complication (HCC)    Excessive daytime sleepiness    GERD (gastroesophageal reflux disease)    Hypermobile Ehlers-Danlos syndrome    Menometrorrhagia    Migraines    Mood disorder (HCC)    Obesity    PCOS (polycystic ovarian syndrome)     Patient Active Problem List   Diagnosis Date Noted   Hair loss 06/25/2022   Tachycardia 06/25/2022   Iron deficiency anemia due to chronic blood loss 08/16/2021   Type 2 diabetes mellitus without complication (HCC) 08/16/2021   Vitamin D deficiency 08/16/2021   Ehlers-Danlos syndrome 10/04/2018   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      Home Medications    Prior to Admission medications   Medication Sig Start Date End Date Taking? Authorizing Provider  albuterol (VENTOLIN HFA) 108 (90 Base) MCG/ACT inhaler Inhale 2 puffs into the lungs every 4 (four) hours as needed for wheezing or shortness of breath. For cough attacks and SOB 01/11/23  Yes Rodriguez-Southworth, Nettie Elm, PA-C  ergocalciferol (VITAMIN D2) 1.25 MG (50000 UT) capsule Take 50,000 Units by mouth once a week.   Yes [provider]  levofloxacin (LEVAQUIN) 750 MG tablet Take 1 tablet (750 mg total) by mouth daily. 01/11/23  Yes Rodriguez-Southworth,  Nettie Elm, PA-C  lisdexamfetamine (VYVANSE) 70 MG capsule Take 1 capsule (70 mg total) by mouth in the morning. 08/25/22  Yes   pantoprazole (PROTONIX) 40 MG tablet Take 40 mg by mouth daily.   Yes [provider]  rizatriptan (MAXALT) 10 MG tablet Take 10 mg by mouth as needed for migraine. May repeat in 2 hours if needed   Yes [provider]  Semaglutide, 1 MG/DOSE, (OZEMPIC, 1 MG/DOSE,) 4 MG/3ML SOPN INJECT 1 MG SUBCUTANEOUS ONCE A WEEK   Yes [provider]  sertraline (ZOLOFT) 100 MG tablet Take 100 mg by mouth daily.   Yes [provider]  temazepam (RESTORIL) 30 MG capsule Take 1 capsule by mouth daily.   Yes [provider]  cetirizine (ZYRTEC ALLERGY) 10 MG tablet Take 1 tablet (10 mg total) by mouth daily. 01/15/23   Doreene Eland, MD  chlorpheniramine-HYDROcodone (TUSSIONEX) 10-8 MG/5ML Take 5 mLs by mouth every 12 (twelve) hours as needed for up to 7 days for cough. 01/15/23 01/22/23  Doreene Eland, MD  ibuprofen (ADVIL) 800 MG tablet Take 800 mg by mouth as needed.    [provider]  metoprolol tartrate (LOPRESSOR) 25 MG tablet Metoprolol Tartrate 25mg  twice daily as needed for palpitations 06/30/22   Jake Bathe, MD  ondansetron (ZOFRAN-ODT) 4 MG disintegrating tablet Take 1 tablet (4 mg total) by mouth every 8 (eight) hours as needed for up to 5 days for nausea or vomiting. 01/15/23 01/20/23  Doreene Eland, MD    Family History Family History  Problem Relation Age of Onset   Sleep disorder Mother    Obstructive Sleep Apnea Father    Diabetes Father    ADD / ADHD Brother     Social History Social History   Tobacco Use   Smoking status: Never   Smokeless tobacco: Never  Substance Use Topics   Alcohol use: Yes    Alcohol/week: 2.0 standard drinks of alcohol    Types: 2 Shots of liquor per week    Comment: occasionally- 2 drinks a week   Drug use: No     Allergies   Patient has no known  allergies.   Review of Systems Review of Systems  Constitutional:  Negative for fever.  Respiratory:  Positive for cough.   Cardiovascular:  Negative for chest pain.  Gastrointestinal:  Positive for vomiting. Negative for abdominal pain and diarrhea.  All other systems reviewed and are negative.    Physical Exam Triage Vital Signs ED Triage Vitals  Encounter Vitals Group     BP 01/15/23 1311 121/87     Systolic BP Percentile --      Diastolic BP Percentile --      Pulse Rate 01/15/23 1311 98     Resp 01/15/23 1311 18     Temp 01/15/23 1311 98.1 F (36.7 C)     Temp src --      SpO2 01/15/23 1311 98 %     Weight --      Height --  Head Circumference --      Peak Flow --      Pain Score 01/15/23 1306 0     Pain Loc --      Pain Education --      Exclude from Growth Chart --    No data found.  Updated Vital Signs BP 121/87   Pulse 98   Temp 98.1 F (36.7 C)   Resp 18   LMP 12/27/2022 (Approximate)   SpO2 98%   Visual Acuity Right Eye Distance:   Left Eye Distance:   Bilateral Distance:    Right Eye Near:   Left Eye Near:    Bilateral Near:     Physical Exam Vitals and nursing note reviewed.  Constitutional:      Appearance: She is not toxic-appearing.  Cardiovascular:     Rate and Rhythm: Normal rate and regular rhythm.     Heart sounds: Normal heart sounds. No murmur heard. Pulmonary:     Effort: Pulmonary effort is normal. No respiratory distress.     Breath sounds: Normal breath sounds. No wheezing.  Neurological:     General: No focal deficit present.     Cranial Nerves: No cranial nerve deficit.     Sensory: Sensation is intact.     Motor: No weakness or abnormal muscle tone.     Comments: No loss of sensation of her UL & LL Power 5/5 across all joints of her UL & LL      UC Treatments / Results  Labs (all labs ordered are listed, but only abnormal results are displayed) Labs Reviewed - No data to display  EKG   Radiology DG  Chest 2 View  Result Date: 01/15/2023 CLINICAL DATA:  Cough EXAM: CHEST - 2 VIEW COMPARISON:  January 11, 2023 FINDINGS: The cardiomediastinal silhouette is normal in contour. No pleural effusion. No pneumothorax. No acute pleuroparenchymal abnormality. Visualized abdomen is unremarkable. No acute osseous abnormality noted. IMPRESSION: No acute cardiopulmonary abnormality. Electronically Signed   By: Meda Klinefelter M.D.   On: 01/15/2023 14:11    Procedures Procedures (including critical care time)  Medications Ordered in UC Medications - No data to display  Initial Impression / Assessment and Plan / UC Course  I have reviewed the triage vital signs and the nursing notes.  Pertinent labs & imaging results that were available during my care of the patient were reviewed by me and considered in my medical decision making (see chart for details).  Clinical Course as of 01/15/23 1602  Sat Jan 15, 2023  1359 Cough - Viral vs Bacterial bronchitis O2 Sat normal with normal pulm exam Her recent chest x-ray done at her last visit was negative for pneumonia She completes treatment with Levaquin today Repeat chest X-ray is negative today Continue Albuterol prn I e-scribed Zyrtec, given nightly cough, and Tussionex prn cough Return precautions discussed [KE]  1401 Nausea & Vomiting May be part of viral illness She does appear well hydrated Zofran prn N/V escribed Keep well hydrated Return if there is no improvement [KE]  1402 Paresthesia: This is likely due to Levaquin. Given symptoms acuity, is less likely to be metabolic than endocrine in nature. However, the option to check  HIV, RPR, TSH, and B12 was given but deferred for now, which is reasonable. We'll see if she improves off Levaquin. Return to PCP soon for further evaluation if there is no resolution of her symptoms. She agreed with the plan. [KE]  1601 Incoming call - Her  pharmacy does not have Tussionex She requests all  medications be called into [facility] I called the pharmacy and canceled the previous prescription (cancellation confirmed by Esmeralda Arthur). [KE]    Clinical Course User Index [KE] Doreene Eland, MD    Final Clinical Impressions(s) / UC Diagnoses   Final diagnoses:  Acute cough  Paresthesia  Nausea and vomiting, unspecified vomiting type     Discharge Instructions      It was nice seeing you. I am sorry about your cough. Your chest xray in negative. I have sent in Tussionex prn cough as well as Zofran prn nausea and vomiting for you.  Do not use Tussionex with other cough medicine. Continue Albuterol as needed and Trial Zyrtec for nightly cough. Please see [place] or your PCP soon if symptoms persist.  As mentioned, Levaquin may cause peripheral neuropathy. Let us see if your tingling will resolve once you stop this medication. Otherwise, follow up soon for more testing.  Stay well.     ED Prescriptions     Medication Sig Dispense Auth. Provider   ondansetron (ZOFRAN-ODT) 4 MG disintegrating tablet  (Status: Discontinued) Take 1 tablet (4 mg total) by mouth every 8 (eight) hours as needed for up to 5 days for nausea or vomiting. 15 tablet Maven Rosander, Al Decant T, MD   chlorpheniramine-HYDROcodone (TUSSIONEX) 10-8 MG/5ML  (Status: Discontinued) Take 5 mLs by mouth every 12 (twelve) hours as needed for cough. 70 mL Janit Pagan T, MD   cetirizine (ZYRTEC ALLERGY) 10 MG tablet  (Status: Discontinued) Take 1 tablet (10 mg total) by mouth daily. 30 tablet Zion Ta T, MD   ondansetron (ZOFRAN-ODT) 4 MG disintegrating tablet Take 1 tablet (4 mg total) by mouth every 8 (eight) hours as needed for up to 5 days for nausea or vomiting. 15 tablet Janit Pagan T, MD   chlorpheniramine-HYDROcodone (TUSSIONEX) 10-8 MG/5ML Take 5 mLs by mouth every 12 (twelve) hours as needed for up to 7 days for cough. 70 mL Janit Pagan T, MD   cetirizine (ZYRTEC ALLERGY) 10 MG tablet Take 1  tablet (10 mg total) by mouth daily. 30 tablet Doreene Eland, MD      I have reviewed the PDMP during this encounter.   Doreene Eland, MD 01/15/23 1415    Doreene Eland, MD 01/15/23 1416    Doreene Eland, MD 01/15/23 [phone]

## 2023-01-15 NOTE — ED Triage Notes (Signed)
Pt reports fever and vomiting . Pt has tingling hands and feet. Pt has ongoing SHOB. Pt seen  on Thursday for PNA.

## 2023-03-26 ENCOUNTER — Encounter (HOSPITAL_BASED_OUTPATIENT_CLINIC_OR_DEPARTMENT_OTHER): Payer: Self-pay | Admitting: Cardiology

## 2023-04-04 ENCOUNTER — Encounter (HOSPITAL_BASED_OUTPATIENT_CLINIC_OR_DEPARTMENT_OTHER): Payer: Self-pay

## 2023-04-04 ENCOUNTER — Ambulatory Visit (HOSPITAL_BASED_OUTPATIENT_CLINIC_OR_DEPARTMENT_OTHER)
Admit: 2023-04-04 | Discharge: 2023-04-04 | Disposition: A | Discharge status: Home / Self Care | Payer: BC Managed Care – PPO | Attending: Physician Assistant | Admitting: Physician Assistant

## 2023-04-04 ENCOUNTER — Ambulatory Visit (HOSPITAL_BASED_OUTPATIENT_CLINIC_OR_DEPARTMENT_OTHER)
Admission: RE | Admit: 2023-04-04 | Discharge: 2023-04-04 | Disposition: A | Discharge status: Home / Self Care | Payer: BC Managed Care – PPO | Source: Ambulatory Visit | Attending: Physician Assistant | Admitting: Physician Assistant

## 2023-04-04 ENCOUNTER — Other Ambulatory Visit (HOSPITAL_BASED_OUTPATIENT_CLINIC_OR_DEPARTMENT_OTHER): Payer: Self-pay

## 2023-04-04 VITALS — BP 135/88 | HR 79 | Temp 98.1°F | Resp 20 | Wt 180.0 lb

## 2023-04-04 DIAGNOSIS — R1032 Left lower quadrant pain: Secondary | ICD-10-CM

## 2023-04-04 DIAGNOSIS — R109 Unspecified abdominal pain: Secondary | ICD-10-CM

## 2023-04-04 DIAGNOSIS — Z87442 Personal history of urinary calculi: Secondary | ICD-10-CM | POA: Diagnosis not present

## 2023-04-04 DIAGNOSIS — R350 Frequency of micturition: Secondary | ICD-10-CM | POA: Diagnosis not present

## 2023-04-04 LAB — POCT URINALYSIS DIP (MANUAL ENTRY)
Bilirubin, UA: NEGATIVE
Glucose, UA: NEGATIVE mg/dL
Ketones, POC UA: NEGATIVE mg/dL
Leukocytes, UA: NEGATIVE
Nitrite, UA: NEGATIVE
Protein Ur, POC: NEGATIVE mg/dL
Spec Grav, UA: 1.015 (ref 1.010–1.025)
Urobilinogen, UA: 0.2 U/dL — AB
pH, UA: 5.5 (ref 5.0–8.0)

## 2023-04-04 LAB — POCT URINE PREGNANCY: Preg Test, Ur: NEGATIVE

## 2023-04-04 MED ORDER — KETOROLAC TROMETHAMINE 30 MG/ML IJ SOLN
30.0000 mg | Freq: Once | INTRAMUSCULAR | Status: AC
  Administered 2023-04-04: 30 mg via INTRAMUSCULAR

## 2023-04-04 MED ORDER — LACTULOSE 10 GM/15ML PO SOLN: 10.0000 g | Freq: Every day | ORAL | 0 refills | Status: AC | PRN

## 2023-04-04 NOTE — Discharge Instructions (Signed)
 Your urine had a little bit of blood likely related to your menstrual cycle.  I do not see any evidence of a kidney stone on your x-ray we will contact you if the radiologist sees something I did not.  I am concerned that you might have constipation contributing to the pain.  Start lactulose .  Make sure you are drinking plenty of fluid and increase the fiber in your diet.  If your symptoms are not improving within a few hours of having a bowel movement or if anything worsens and you have severe pain, fever, nausea, vomiting, blood in your stool, worsening abdominal pain you need to go to the emergency room immediately for further evaluation and management as we discussed.

## 2023-04-04 NOTE — ED Provider Notes (Signed)
 [facility]    CSN: [id number] Arrival date & time: 04/04/23  1011      History   Chief Complaint Chief Complaint  Patient presents with   Abdominal Pain    Severe lower left sided abdominal pain. I think it might be a kidney stone. - Entered by patient    HPI Kaitlyn Alvarez is a 33 y.o. female.   Patient presents today with a several hour history of significant left lower back/flank pain.  She was up and ready for work when the symptoms began.  She reports the pain is constant but has waves of increased intensity, localized to her left lower back with radiation to her left lower abdomen, rated 10 on a 0 10 pain scale, described as sharp.  She has not tried any over-the-counter medication for symptom management.  She does have a history of kidney stones several years ago that she was able to pass on her own.  She has not seen a urologist.  She does report some urinary frequency/urgency but denies any hematuria, dysuria.  She has no concern for pregnancy as she is currently on her menstrual cycle.  She denies any vaginal discharge and has no concern for STI.  Denies pelvic pain.  She denies any recent antibiotics.  She denies any fever, nausea, vomiting.  Denies history of gastrointestinal disorder including diverticulitis, ulcerative colitis, Crohn's disease.  Her last bowel movement was yesterday evening and was normal without blood or mucus.    Past Medical History:  Diagnosis Date   Adenomatous colon polyp    Anxiety    Chronic fatigue    Depression    Diabetes mellitus without complication (HCC)    Excessive daytime sleepiness    GERD (gastroesophageal reflux disease)    Hypermobile Ehlers-Danlos syndrome    Menometrorrhagia    Migraines    Mood disorder (HCC)    Obesity    PCOS (polycystic ovarian syndrome)     Patient Active Problem List   Diagnosis Date Noted   Hair loss 06/25/2022   Tachycardia 06/25/2022   Iron deficiency anemia due to chronic blood  loss 08/16/2021   Type 2 diabetes mellitus without complication (HCC) 08/16/2021   Vitamin D deficiency 08/16/2021   Ehlers-Danlos syndrome 10/04/2018   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      Home Medications    Prior to Admission medications   Medication Sig Start Date End Date Taking? Authorizing Provider  lactulose  (CHRONULAC ) 10 GM/15ML solution Take 15 mLs (10 g total) by mouth daily as needed for up to 5 days for mild constipation. 04/04/23 04/09/23 Yes Vail Vuncannon, Betsey Brow, PA-C  albuterol  (VENTOLIN  HFA) 108 (90 Base) MCG/ACT inhaler Inhale 2 puffs into the lungs every 4 (four) hours as needed for wheezing or shortness of breath. For cough attacks and SOB 01/11/23   Rodriguez-Southworth, Lamond Pilot, PA-C  cetirizine  (ZYRTEC  ALLERGY) 10 MG  tablet Take 1 tablet (10 mg total) by mouth daily. 01/15/23   Arn Lane, MD  ergocalciferol (VITAMIN D2) 1.25 MG (50000 UT) capsule Take 50,000 Units by mouth once a week.    [provider]  ibuprofen (ADVIL) 800 MG tablet Take 800 mg by mouth as needed.    [provider]  levofloxacin  (LEVAQUIN ) 750 MG tablet Take 1 tablet (750 mg total) by mouth daily. 01/11/23   Rodriguez-Southworth, Sylvia, PA-C  lisdexamfetamine (VYVANSE ) 70 MG capsule Take 1 capsule (70 mg total) by mouth in the morning. 08/25/22      metoprolol  tartrate (LOPRESSOR ) 25 MG tablet Metoprolol  Tartrate 25mg  twice daily as needed for palpitations 06/30/22   Hugh Madura, MD  pantoprazole (PROTONIX) 40 MG tablet Take 40 mg by mouth daily.    [provider]  rizatriptan (MAXALT) 10 MG tablet Take 10 mg by mouth as needed for migraine. May repeat in 2 hours if needed    [provider]  Semaglutide , 1 MG/DOSE, (OZEMPIC , 1 MG/DOSE,) 4 MG/3ML SOPN INJECT 1 MG SUBCUTANEOUS ONCE A WEEK    [provider]  sertraline (ZOLOFT) 100 MG tablet Take 100 mg by mouth daily.    [provider]  temazepam (RESTORIL) 30 MG capsule Take 1 capsule by mouth daily.    [provider]    Family History Family History  Problem Relation Age of Onset   Sleep disorder Mother    Obstructive Sleep Apnea Father    Diabetes Father    ADD / ADHD Brother     Social History Social History   Tobacco Use   Smoking status: Never   Smokeless tobacco: Never  Substance Use Topics   Alcohol use: Yes    Alcohol/week: 2.0 standard drinks of alcohol    Types: 2 Shots of liquor per week    Comment: occasionally- 2 drinks a week   Drug use: No     Allergies   Patient has no known allergies.   Review of Systems Review of Systems  Constitutional:  Positive for activity change. Negative for appetite change, fatigue and fever.  Gastrointestinal:  Positive for abdominal pain. Negative for constipation, diarrhea, nausea and vomiting.  Genitourinary:  Positive for flank pain, frequency and urgency. Negative for dysuria, vaginal bleeding, vaginal discharge and vaginal pain.  Musculoskeletal:  Positive for back pain. Negative for arthralgias and myalgias.     Physical Exam Triage Vital Signs ED Triage Vitals  Encounter Vitals Group     BP 04/04/23 1216 135/88     Systolic BP Percentile --      Diastolic BP Percentile --      Pulse Rate 04/04/23 1216 79     Resp 04/04/23 1216 20     Temp 04/04/23 1216 98.1  F (36.7 C)     Temp Source 04/04/23 1216 Oral     SpO2 04/04/23 1216 98 %     Weight 04/04/23 1218 180 lb (81.6 kg)     Height --      Head Circumference --      Peak Flow --      Pain Score 04/04/23 1218 10     Pain Loc --      Pain Education --      Exclude from Growth Chart --    No data found.  Updated Vital Signs BP 135/88 (BP Location: Right Arm)   Pulse 79   Temp 98.1 F (36.7 C) (Oral)   Resp 20  Wt 180 lb (81.6 kg)   LMP 04/04/2023 (Exact Date)   SpO2 98%   BMI 29.95 kg/m   Visual Acuity Right Eye Distance:   Left Eye Distance:   Bilateral Distance:    Right Eye Near:   Left Eye Near:    Bilateral Near:     Physical Exam Vitals reviewed.  Constitutional:      General: She is awake. She is not in acute distress.    Appearance: Normal appearance. She is well-developed. She is not ill-appearing.     Comments: Very pleasant female appears stated age in no acute distress  HENT:     Head: Normocephalic and atraumatic.  Cardiovascular:     Rate and Rhythm: Normal rate and regular rhythm.     Heart sounds: Normal heart sounds, S1 normal and S2 normal. No murmur heard. Pulmonary:     Effort: Pulmonary effort is normal.     Breath sounds: Normal breath sounds. No wheezing, rhonchi or rales.     Comments: Clear to auscultation bilaterally Abdominal:     General: Bowel sounds are normal.     Palpations: Abdomen is soft.     Tenderness: There is no abdominal tenderness. There is left CVA tenderness. There is no right CVA tenderness, guarding or rebound.  Musculoskeletal:     Cervical back: No tenderness or bony tenderness.     Thoracic back: No tenderness or bony tenderness.     Lumbar back: Tenderness present. No bony tenderness. Negative right straight leg raise test and negative left straight leg raise test.     Comments: Mild to palpation over left lumbar paraspinal muscles.  No deformity or step-off noted.  No pain percussion of vertebrae.  Skin:     General: Skin is warm.     Findings: No rash.  Psychiatric:        Behavior: Behavior is cooperative.      UC Treatments / Results  Labs (all labs ordered are listed, but only abnormal results are displayed) Labs Reviewed  POCT URINALYSIS DIP (MANUAL ENTRY) - Abnormal; Notable for the following components:      Result Value   Color, UA light yellow (*)    Blood, UA trace-intact (*)    Urobilinogen, UA 0.2 (*)    All other components within normal limits  POCT URINE PREGNANCY - Normal  URINE CULTURE    EKG   Radiology No results found.  Procedures Procedures (including critical care time)  Medications Ordered in UC Medications  ketorolac  (TORADOL ) 30 MG/ML injection 30 mg (30 mg Intramuscular Given 04/04/23 1310)    Initial Impression / Assessment and Plan / UC Course  I have reviewed the triage vital signs and the nursing notes.  Pertinent labs & imaging results that were available during my care of the patient were reviewed by me and considered in my medical decision making (see chart for details).     Patient is well-appearing, afebrile, nontoxic, nontachycardic.  Vital signs and physical exam are reassuring without indication for emergent evaluation or imaging.  Urine did have blood which she is actively on her cycle so we discussed that this is the likely cause of the blood noted on UA.  There was no other evidence of infection.  Will send for culture given her clinical presentation but defer antibiotics until results are available.  Urine pregnancy was negative.  She denied any pelvic pain or vaginal discharge show additional testing was deferred.  She was given Toradol  which provided  some relief of symptoms.  Given her ongoing pain we did discuss potential utility of going to the ER, however, she did have some relief and preferred to try other medications at declined ER evaluation at this time.  Acute abdomen films were obtained that did not show any radiopaque calculi  based on my primary read but did show moderate stool burden particularly in descending colon.  We discussed that this could be contributing to her symptoms and so he was started on lactulose .  At the time of discharge we were waiting for radiologist over read and we will contact her if this differs and changes our treatment plan.  We discussed that if she does not have a bowel movement within a few hours of starting lactulose  or if this does not provide her relief of symptoms she needs to be reevaluated.  We discussed that she should have a low threshold for going to the emergency room if she has severe abdominal pain, fever, nausea, vomiting, difficulty passing stool or gas, weakness she needs to go to the ER to which she expressed understanding.  Strict return precautions given.  Excuse note provided.  Final Clinical Impressions(s) / UC Diagnoses   Final diagnoses:  Flank pain  LLQ abdominal pain     Discharge Instructions      Your urine had a little bit of blood likely related to your menstrual cycle.  I do not see any evidence of a kidney stone on your x-ray we will contact you if the radiologist sees something I did not.  I am concerned that you might have constipation contributing to the pain.  Start lactulose .  Make sure you are drinking plenty of fluid and increase the fiber in your diet.  If your symptoms are not improving within a few hours of having a bowel movement or if anything worsens and you have severe pain, fever, nausea, vomiting, blood in your stool, worsening abdominal pain you need to go to the emergency room immediately for further evaluation and management as we discussed.     ED Prescriptions     Medication Sig Dispense Auth. Provider   lactulose  (CHRONULAC ) 10 GM/15ML solution Take 15 mLs (10 g total) by mouth daily as needed for up to 5 days for mild constipation. 75 mL Keeyon Privitera K, PA-C      PDMP not reviewed this encounter.   Budd Cargo, PA-C 04/04/23  1348

## 2023-04-04 NOTE — ED Triage Notes (Signed)
 Left lower abd pain onset this morning with radiation now around to left flank. States hx of kidney stone x 1 2 years ago. Patient sitting quietly on cart. Skin p/w/d. No shifting in chair at this time. States pain is constant. Has taken no meds today for pain.

## 2023-04-05 ENCOUNTER — Encounter: Payer: Self-pay | Admitting: Cardiology

## 2023-04-05 ENCOUNTER — Ambulatory Visit: Payer: BC Managed Care – PPO | Attending: Cardiology | Admitting: Cardiology

## 2023-04-05 VITALS — BP 98/78 | HR 70 | Ht 65.0 in | Wt 190.8 lb

## 2023-04-05 DIAGNOSIS — R002 Palpitations: Secondary | ICD-10-CM

## 2023-04-05 DIAGNOSIS — Z09 Encounter for follow-up examination after completed treatment for conditions other than malignant neoplasm: Secondary | ICD-10-CM

## 2023-04-05 DIAGNOSIS — Z8249 Family history of ischemic heart disease and other diseases of the circulatory system: Secondary | ICD-10-CM | POA: Diagnosis not present

## 2023-04-05 DIAGNOSIS — R9431 Abnormal electrocardiogram [ECG] [EKG]: Secondary | ICD-10-CM

## 2023-04-05 NOTE — Progress Notes (Signed)
Cardiology Office Note:  .   Date:  04/05/2023  ID:  Kaitlyn Alvarez, DOB 21-Oct-1990, MRN [id number] PCP: Camie Patience, FNP  [facility] Providers Cardiologist:  Donato Schultz, MD     History of Present Illness: .   Kaitlyn Alvarez is a 33 y.o. 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  She experiences chest pain, fatigue, occasional dizziness, and orthostasis. Her heart rate ranges from 100 to 120 beats per minute. She describes an episode of sharp, stabbing chest pain radiating to her shoulder, exacerbated by breathing, lasting about five minutes. This episode occurred approximately two weeks ago, with preceding chest pain the week before. She associates the pain with osteochondritis.  She has a history of hypermobile Ehlers-Danlos syndrome but reports no aortic issues. Her ejection fraction was 65% on an echocardiogram in 2020, with normal valvular function. She has been prescribed metoprolol 25 mg twice a day as needed for palpitations, which she has not needed to take frequently.  Her family history is significant for coronary artery disease. Her grandmother had a heart attack in March 2021 with 100% blockage of the left anterior descending artery. Her uncle, after a nuclear stress test, was found to have a 100% blockage of the same artery, and her father has a 70% blockage. Both her uncle and father were asymptomatic. Her uncle is in his early sixties and her father in his late fifties, both relatively healthy.          Studies Reviewed: Marland Kitchen   EKG Interpretation Date/Time:  Tuesday April 05 2023 13:41:29 EST Ventricular Rate:  91 PR Interval:  128 QRS Duration:  80 QT Interval:  370 QTC Calculation: 455 R Axis:   64  Text Interpretation: Normal sinus rhythm Low voltage QRS No  previous ECGs available Confirmed by Donato Schultz ([id number]) on 04/05/2023 1:50:51 PM    Results   LABS TSH: 2.2 (04/05/2023) Hb: 12.5 (04/05/2023) Cr: 0.98 (04/05/2023) LDL: 102 (04/05/2023)  DIAGNOSTIC Echocardiogram: Ejection fraction 65%, normal valvular function (2020) EKG: No change (04/05/2023)     Risk Assessment/Calculations:            Physical Exam:   VS:  BP 98/78   Pulse 70   Ht 5\' 5"  (1.651 m)   Wt 190 lb 12.8 oz (86.5 kg)   LMP 04/04/2023 (Exact Date)   SpO2 95%   BMI 31.75 kg/m    Wt Readings from Last 3 Encounters:  04/05/23 190 lb 12.8 oz (86.5 kg)  04/04/23 180 lb (81.6 kg)  06/30/22 201 lb (91.2 kg)    GEN: Well nourished, well developed in no acute distress NECK: No JVD; No carotid bruits CARDIAC: RRR, no murmurs, no rubs, no gallops RESPIRATORY:  Clear to auscultation without rales, wheezing or rhonchi  ABDOMEN: Soft, non-tender, non-distended EXTREMITIES:  No edema; No deformity   ASSESSMENT AND PLAN: .    Assessment and Plan    Chest Pain Intermittent chest pain with occasional sharp, stabbing pain radiating to the shoulder, exacerbated by breathing. No significant changes in EKG. Family history of significant coronary artery disease (LAD disease) in father, uncle, and grandmother. Normal echocardiogram in 2020 with an ejection fraction of 65% and normal valvular function. Discussed calcium score test as a preventive measure due to strong family history. Explained low  radiation and $99 cost, not covered by insurance. If calcium is detected, aggressive preventive measures will be considered. If zero, no repeat test for ten years. - Order calcium score test - PRN follow-up based on calcium score results  Palpitations Episodes of increased heart rate (100-120 bpm) and orthostasis. Previously prescribed metoprolol 25 mg twice a day as needed for palpitations. No recent use of metoprolol reported. 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 No new symptoms reported.  General Health Maintenance Lost 46 pounds on Ozempic. Current labs: TSH 2.2, hemoglobin 12.5, creatinine 0.98, LDL 102. - Encourage continued weight management and healthy lifestyle  Follow-up - PRN follow-up based on calcium score results.             Signed, Donato Schultz, MD

## 2023-04-05 NOTE — Patient Instructions (Signed)
Medication Instructions:  The current medical regimen is effective;  continue present plan and medications.  *If you need a refill on your cardiac medications before your next appointment, please call your pharmacy*  Testing/Procedures: Your physician has requested that you have a Coronary Calcium score which is completed by CT. Cardiac computed tomography (CT) is a painless test that uses an x-ray machine to take clear, detailed pictures of your heart. There are no instructions for this testing.  You may eat/drink and take your normal medications this day.  The cost of the testing is $99 due at the time of your appointment.   Follow-Up: At [facility], you and your health needs are our priority.  As part of our continuing mission to provide you with exceptional heart care, we have created designated Provider Care Teams.  These Care Teams include your primary Cardiologist (physician) and Advanced Practice Providers (APPs -  Physician Assistants and Nurse Practitioners) who all work together to provide you with the care you need, when you need it.  We recommend signing up for the patient portal called "MyChart".  Sign up information is provided on this After Visit Summary.  MyChart is used to connect with patients for Virtual Visits (Telemedicine).  Patients are able to view lab/test results, encounter notes, upcoming appointments, etc.  Non-urgent messages can be sent to your provider as well.   To learn more about what you can do with MyChart, go to [email or web address].    Your next appointment:   Follow up will be based on the results of the above testing.

## 2023-04-06 LAB — URINE CULTURE: Culture: NO GROWTH

## 2023-04-14 ENCOUNTER — Other Ambulatory Visit (HOSPITAL_BASED_OUTPATIENT_CLINIC_OR_DEPARTMENT_OTHER): Payer: Self-pay

## 2023-04-14 MED ORDER — LISDEXAMFETAMINE DIMESYLATE 70 MG PO CAPS
70.0000 mg | ORAL_CAPSULE | Freq: Every morning | ORAL | 0 refills | Status: AC
  Filled 2023-04-14: qty 30, 30d supply, fill #0
  Filled 2023-04-15: qty 90, 90d supply, fill #0

## 2023-04-15 ENCOUNTER — Ambulatory Visit (HOSPITAL_COMMUNITY): Payer: BC Managed Care – PPO

## 2023-04-15 ENCOUNTER — Other Ambulatory Visit (HOSPITAL_BASED_OUTPATIENT_CLINIC_OR_DEPARTMENT_OTHER): Payer: Self-pay

## 2023-04-15 ENCOUNTER — Ambulatory Visit (HOSPITAL_BASED_OUTPATIENT_CLINIC_OR_DEPARTMENT_OTHER)
Admission: RE | Admit: 2023-04-15 | Discharge: 2023-04-15 | Disposition: A | Discharge status: Home / Self Care | Payer: Self-pay | Source: Ambulatory Visit | Attending: Cardiology | Admitting: Cardiology

## 2023-04-15 DIAGNOSIS — Z8249 Family history of ischemic heart disease and other diseases of the circulatory system: Secondary | ICD-10-CM | POA: Insufficient documentation

## 2023-04-15 DIAGNOSIS — R9431 Abnormal electrocardiogram [ECG] [EKG]: Secondary | ICD-10-CM | POA: Insufficient documentation

## 2023-04-16 ENCOUNTER — Encounter: Payer: Self-pay | Admitting: Cardiology

## 2023-11-05 ENCOUNTER — Emergency Department (HOSPITAL_BASED_OUTPATIENT_CLINIC_OR_DEPARTMENT_OTHER)
Admission: EM | Admit: 2023-11-05 | Discharge: 2023-11-05 | Disposition: A | Discharge status: Home / Self Care | Attending: Emergency Medicine | Admitting: Emergency Medicine

## 2023-11-05 ENCOUNTER — Encounter (HOSPITAL_BASED_OUTPATIENT_CLINIC_OR_DEPARTMENT_OTHER): Payer: Self-pay

## 2023-11-05 ENCOUNTER — Emergency Department (HOSPITAL_BASED_OUTPATIENT_CLINIC_OR_DEPARTMENT_OTHER)

## 2023-11-05 ENCOUNTER — Other Ambulatory Visit: Payer: Self-pay

## 2023-11-05 DIAGNOSIS — E119 Type 2 diabetes mellitus without complications: Secondary | ICD-10-CM | POA: Insufficient documentation

## 2023-11-05 DIAGNOSIS — Z794 Long term (current) use of insulin: Secondary | ICD-10-CM | POA: Insufficient documentation

## 2023-11-05 DIAGNOSIS — R051 Acute cough: Secondary | ICD-10-CM

## 2023-11-05 DIAGNOSIS — U071 COVID-19: Secondary | ICD-10-CM | POA: Insufficient documentation

## 2023-11-05 DIAGNOSIS — R079 Chest pain, unspecified: Secondary | ICD-10-CM | POA: Diagnosis present

## 2023-11-05 DIAGNOSIS — R0789 Other chest pain: Secondary | ICD-10-CM

## 2023-11-05 LAB — CBC
HCT: 39.4 % (ref 36.0–46.0)
Hemoglobin: 13 g/dL (ref 12.0–15.0)
MCH: 26.2 pg (ref 26.0–34.0)
MCHC: 33 g/dL (ref 30.0–36.0)
MCV: 79.4 fL — ABNORMAL LOW (ref 80.0–100.0)
Platelets: 286 K/uL (ref 150–400)
RBC: 4.96 MIL/uL (ref 3.87–5.11)
RDW: 14.2 % (ref 11.5–15.5)
WBC: 6 K/uL (ref 4.0–10.5)
nRBC: 0 % (ref 0.0–0.2)

## 2023-11-05 LAB — RESP PANEL BY RT-PCR (RSV, FLU A&B, COVID)  RVPGX2
Influenza A by PCR: NEGATIVE
Influenza B by PCR: NEGATIVE
Resp Syncytial Virus by PCR: NEGATIVE
SARS Coronavirus 2 by RT PCR: POSITIVE — AB

## 2023-11-05 LAB — BASIC METABOLIC PANEL WITH GFR
Anion gap: 14 (ref 5–15)
BUN: 6 mg/dL (ref 6–20)
CO2: 20 mmol/L — ABNORMAL LOW (ref 22–32)
Calcium: 9.2 mg/dL (ref 8.9–10.3)
Chloride: 106 mmol/L (ref 98–111)
Creatinine, Ser: 0.98 mg/dL (ref 0.44–1.00)
GFR, Estimated: >60 mL/min (ref >60)
Glucose, Bld: 106 mg/dL — ABNORMAL HIGH (ref 70–99)
Potassium: 3.7 mmol/L (ref 3.5–5.1)
Sodium: 139 mmol/L (ref 135–145)

## 2023-11-05 LAB — D-DIMER, QUANTITATIVE: D-Dimer, Quant: 1.11 ug{FEU}/mL — ABNORMAL HIGH (ref 0.00–0.50)

## 2023-11-05 LAB — TROPONIN T, HIGH SENSITIVITY: Troponin T High Sensitivity: <15 ng/L (ref 0–19)

## 2023-11-05 LAB — PREGNANCY, URINE: Preg Test, Ur: NEGATIVE

## 2023-11-05 MED ORDER — SODIUM CHLORIDE 0.9 % IV SOLN: Freq: Once | INTRAVENOUS | Status: DC

## 2023-11-05 MED ORDER — CETIRIZINE-PSEUDOEPHEDRINE ER 5-120 MG PO TB12: 1.0000 | ORAL_TABLET | Freq: Every day | ORAL | 0 refills | Status: AC | PRN

## 2023-11-05 MED ORDER — SODIUM CHLORIDE 0.9 % IV BOLUS
1000.0000 mL | Freq: Once | INTRAVENOUS | Status: AC
Start: 2023-11-05 — End: 2023-11-05
  Administered 2023-11-05: 1000 mL via INTRAVENOUS

## 2023-11-05 MED ORDER — BENZONATATE 100 MG PO CAPS: 100.0000 mg | ORAL_CAPSULE | Freq: Three times a day (TID) | ORAL | 0 refills | Status: AC | PRN

## 2023-11-05 MED ORDER — SODIUM CHLORIDE 0.9 % IV BOLUS
1000.0000 mL | Freq: Once | INTRAVENOUS | Status: AC
  Administered 2023-11-05: 1000 mL via INTRAVENOUS

## 2023-11-05 MED ORDER — ACETAMINOPHEN 500 MG PO TABS
1000.0000 mg | ORAL_TABLET | Freq: Once | ORAL | Status: AC
Start: 2023-11-05 — End: 2023-11-05
  Administered 2023-11-05: 1000 mg via ORAL
  Filled 2023-11-05: qty 2

## 2023-11-05 MED ORDER — PAXLOVID (300/100) 20 X 150 MG & 10 X 100MG PO TBPK: 3.0000 | ORAL_TABLET | Freq: Two times a day (BID) | ORAL | 0 refills | Status: DC

## 2023-11-05 MED ORDER — TRIAMCINOLONE ACETONIDE 55 MCG/ACT NA AERO: 2.0000 | INHALATION_SPRAY | Freq: Every day | NASAL | 0 refills | Status: AC

## 2023-11-05 MED ORDER — ALBUTEROL SULFATE HFA 108 (90 BASE) MCG/ACT IN AERS: 1.0000 | INHALATION_SPRAY | Freq: Four times a day (QID) | RESPIRATORY_TRACT | 0 refills | Status: AC | PRN

## 2023-11-05 MED ORDER — IOHEXOL 350 MG/ML SOLN
75.0000 mL | Freq: Once | INTRAVENOUS | Status: AC | PRN
  Administered 2023-11-05: 75 mL via INTRAVENOUS

## 2023-11-05 MED ORDER — PAXLOVID (300/100) 20 X 150 MG & 10 X 100MG PO TBPK
3.0000 | ORAL_TABLET | Freq: Two times a day (BID) | ORAL | 0 refills | Status: AC
Start: 2023-11-05 — End: 2023-11-10

## 2023-11-05 NOTE — ED Provider Notes (Signed)
  EMERGENCY DEPARTMENT AT [facility] Provider Note   CSN: [id number] Arrival date & time: 11/05/23  1543     Patient presents with: Hypotension and Covid Positive   Kaitlyn Alvarez is a 33 y.o. female.   HPI   33 year old female presents emergency department with complaints of cough, chest pain, body aches/chills, fever.  States has been ill with symptoms for the past couple of days.  Known exposure to family ember that had COVID.  Had 2 at home test that were positive for COVID.  Went to an urgent care prior to the ED was found to be hypotensive reportedly with blood pressures in the 60s.  States that they only took her blood pressure once and sent her immediately to the emergency department.  States the chest pain midsternal worsened with taking a deep breath as well as coughing.  Denies any history of DVT/PE, recent surgery/immobilization, known coagulopathy, known malignancy, hormonal therapy.  Denies any abdominal pain, nausea, vomiting.  Has taken nothing for her fever today.  Presents emergency department for further assessment/evaluation.  Past medical history significant for chronic fatigue, diabetes mellitus, GERD, Ehlers-Danlos syndrome, migraines, PCOS  Prior to Admission medications   Medication Sig Start Date End Date Taking? Authorizing Provider  botulinum toxin Type A (BOTOX) 100 units SOLR injection Inject 100 Units into the muscle every 3 (three) months. 01/25/18   [provider]  ergocalciferol (VITAMIN D2) 1.25 MG (50000 UT) capsule Take 50,000 Units by mouth once a week.    [provider]  ibuprofen (ADVIL) 800 MG tablet Take 800 mg by mouth as needed.    [provider]  lisdexamfetamine (VYVANSE ) 70 MG capsule Take 1 capsule (70 mg total) by mouth in the morning. 08/25/22     lisdexamfetamine (VYVANSE ) 70 MG capsule Take 1 capsule (70 mg total) by mouth every morning. 04/14/23     metoprolol  tartrate (LOPRESSOR ) 25 MG  tablet Metoprolol  Tartrate 25mg  twice daily as needed for palpitations 06/30/22   Jeffrie Oneil JAYSON, MD  pantoprazole (PROTONIX) 40 MG tablet Take 40 mg by mouth daily.    [provider]  prochlorperazine (COMPAZINE) 10 MG tablet Take 10 mg by mouth every 8 (eight) hours as needed for nausea. 04/01/23   [provider]  rizatriptan (MAXALT) 10 MG tablet Take 10 mg by mouth as needed for migraine. May repeat in 2 hours if needed    [provider]  Semaglutide , 1 MG/DOSE, (OZEMPIC , 1 MG/DOSE,) 4 MG/3ML SOPN INJECT 1 MG SUBCUTANEOUS ONCE A WEEK    [provider]  sertraline (ZOLOFT) 100 MG tablet Take 100 mg by mouth daily.    [provider]  temazepam (RESTORIL) 30 MG capsule Take 1 capsule by mouth daily.    [provider]    Allergies: Patient has no known allergies.    Review of Systems  All other systems reviewed and are negative.   Updated Vital Signs BP 131/74 (BP Location: Right Arm)   Pulse (!) 143   Temp (!) 100.5 F (38.1 C) (Oral)   Resp 20   Ht 5' 5 (1.651 m)   Wt 86.2 kg   SpO2 95%   BMI 31.62 kg/m   Physical Exam Vitals and nursing note reviewed.  Constitutional:      General: She is not in acute distress.    Appearance: She is well-developed.     Comments: Palpably warm to the touch.  HENT:     Head: Normocephalic  and atraumatic.  Eyes:     Conjunctiva/sclera: Conjunctivae normal.  Cardiovascular:     Rate and Rhythm: Regular rhythm. Tachycardia present.     Heart sounds: No murmur heard. Pulmonary:     Effort: Pulmonary effort is normal. No respiratory distress.     Breath sounds: Normal breath sounds. No wheezing, rhonchi or rales.     Comments: Midsternal chest tenderness. Abdominal:     Palpations: Abdomen is soft.     Tenderness: There is no abdominal tenderness.  Musculoskeletal:        General: No swelling.     Cervical back: Neck supple.     Right lower leg: No edema.     Left lower leg: No  edema.  Skin:    General: Skin is warm and dry.     Capillary Refill: Capillary refill takes less than 2 seconds.  Neurological:     Mental Status: She is alert.  Psychiatric:        Mood and Affect: Mood normal.     (all labs ordered are listed, but only abnormal results are displayed) Labs Reviewed  CBC - Abnormal; Notable for the following components:      Result Value   MCV 79.4 (*)    All other components within normal limits  RESP PANEL BY RT-PCR (RSV, FLU A&B, COVID)  RVPGX2  BASIC METABOLIC PANEL WITH GFR  PREGNANCY, URINE  D-DIMER, QUANTITATIVE  TROPONIN T, HIGH SENSITIVITY    EKG: None  Radiology: DG Chest Port 1 View Result Date: 11/05/2023 EXAM: 1 VIEW XRAY OF THE CHEST 11/05/2023 04:30:00 PM COMPARISON: 01/15/2023 CLINICAL HISTORY: 355200 Chest pain [id number]. Patient is COVID positive and has had generalized chest pain x 2 days and hypotensive today. Patient is sweating. FINDINGS: LUNGS AND PLEURA: No focal pulmonary opacity. No pulmonary edema. No pleural effusion. No pneumothorax. HEART AND MEDIASTINUM: No acute abnormality of the cardiac and mediastinal silhouettes. BONES AND SOFT TISSUES: No acute osseous abnormality. IMPRESSION: 1. No acute process. Electronically signed by: Katheleen Faes MD 11/05/2023 04:38 PM EDT RP Workstation: [id number]     Procedures   Medications Ordered in the ED  acetaminophen  (TYLENOL ) tablet 1,000 mg (has no administration in time range)  sodium chloride  0.9 % bolus 1,000 mL (1,000 mLs Intravenous New Bag/Given 11/05/23 1632)                                    Medical Decision Making Amount and/or Complexity of Data Reviewed Labs: ordered. Radiology: ordered.  Risk OTC drugs. Prescription drug management.   This patient presents to the ED for concern of cough, fever, COVID, chest pain, this involves an extensive number of treatment options, and is a complaint that carries with it a high risk of complications and  morbidity.  The differential diagnosis includes COVID, flu, RSV, pneumonia, MSK, myocarditis/pericarditis/tamponade, PE, ACS, other   Co morbidities that complicate the patient evaluation  See HPI   Additional history obtained:  Additional history obtained from EMR External records from outside source obtained and reviewed including hospital records   Lab Tests:  I Ordered, and personally interpreted labs.  The pertinent results include: No leukocytosis.  No evidence of anemia.  Platelets within range.  Mild decreased bicarb of 20 otherwise, lites within normal limits.  No transaminitis.  COVID-positive.  D-dimer elevated 1.11; see below for CTA.  Troponin negative.  Urine pregnancy negative.   Imaging Studies  ordered:  I ordered imaging studies including chest x-ray, CT angio chest PE I independently visualized and interpreted imaging which showed  Chest x-ray: No acute cardiopulmonary abnormality. CT angio chest PE: No evidence of PE, pneumonia or other acute cardiopulmonary abnormality I agree with the radiologist interpretation   Cardiac Monitoring: / EKG:  The patient was maintained on a cardiac monitor.  I personally viewed and interpreted the cardiac monitored which showed an underlying rhythm of: Sinus tachycardia   Consultations Obtained:  N/a   Problem List / ED Course / Critical interventions / Medication management  COVID, chest wall pain, cough I ordered medication including saline, Tylenol    Reevaluation of the patient after these medicines showed that the patient improved I have reviewed the patients home medicines and have made adjustments as needed   Social Determinants of Health:  Denies tobacco or illicit drug use.   Test / Admission - Considered:  COVID, chest wall pain, cough Vitals signs significant for initial fever, tachycardia which improved with time elapsed within emergency department as well as medications administered.. Otherwise  within normal range and stable throughout visit. Laboratory/imaging studies significant for: See above 33 year old female presents emergency department with complaints of cough, chest pain, body aches/chills, fever.  States has been ill with symptoms for the past couple of days.  Known exposure to family ember that had COVID.  Had 2 at home test that were positive for COVID.  Went to an urgent care prior to the ED was found to be hypotensive reportedly with blood pressures in the 60s.  States that they only took her blood pressure once and sent her immediately to the emergency department.  States the chest pain midsternal worsened with taking a deep breath as well as coughing.  Denies any history of DVT/PE, recent surgery/immobilization, known coagulopathy, known malignancy, hormonal therapy.  Denies any abdominal pain, nausea, vomiting.  Has taken nothing for her fever today.  Presents emergency department for further assessment/evaluation. On exams, lungs clear to oscillation bilaterally.  No abdominal tenderness.  Chest wall tenderness anteriorly.  Workup today reassuring.  Negative troponin, lack of acute ischemic change on EKG; low suspicion for ACS.  Patient was tachycardic as well as febrile upon arrival which did significant improved with IV fluids, antipyretic with heart rate of 101 at discharge.  CT angio chest PE was ordered secondary to D-dimer elevation which was negative for PE, pneumonia or other acute cardiopulmonary abnormality.  Viral testing was positive for COVID.  Suspect this is most likely causing symptoms.  Suspect the chest discomfort is most likely from musculoskeletal etiology.  Will recommend supportive therapy as in AVS and close follow-up with primary care.  Patient did request Paxlovid  after risks/side effects discussed regarding this medication.  Treatment plan discussed with patient and she is understanding was agreeable.  Patient well-appearing, afebrile in no acute distress upon  discharge. Worrisome signs and symptoms were discussed with the patient, and the patient acknowledged understanding to return to the ED if noticed. Patient was stable upon discharge.       Final diagnoses:  None    ED Discharge Orders     None          Silver Wonda LABOR, [place] 11/05/23 1857    Yolande Lamar BROCKS, MD 11/06/23 [phone]

## 2023-11-05 NOTE — Discharge Instructions (Signed)
 As discussed, your workup today was reassuring.  You did test positive for COVID for us .  CT scan did not show obvious pneumonia, blood clot in the lung.  Recommend continued oral hydration at home.  Will send in with cough medicine to use as needed.  Recommend follow-up with primary care for reassessment.6

## 2023-11-05 NOTE — ED Triage Notes (Signed)
 Arrives POV with complaints of chest pain and being covid positive. Patient has not been feeling well x2 days and she was seen at Urgent Care today. She had low blood pressure (60's systolic). Sent here for further evaluation.
